# Patient Record
Sex: Female | Born: 1985
Health system: Southern US, Community
[De-identification: ages and names within clinical notes are randomized; demographics above are authoritative.]

## PROBLEM LIST (undated history)

## (undated) DIAGNOSIS — F32A Depression, unspecified: Secondary | ICD-10-CM

## (undated) DIAGNOSIS — F410 Panic disorder [episodic paroxysmal anxiety] without agoraphobia: Secondary | ICD-10-CM

## (undated) DIAGNOSIS — Z5189 Encounter for other specified aftercare: Secondary | ICD-10-CM

## (undated) DIAGNOSIS — O1490 Unspecified pre-eclampsia, unspecified trimester: Secondary | ICD-10-CM

## (undated) DIAGNOSIS — O119 Pre-existing hypertension with pre-eclampsia, unspecified trimester: Secondary | ICD-10-CM

## (undated) DIAGNOSIS — K219 Gastro-esophageal reflux disease without esophagitis: Secondary | ICD-10-CM

## (undated) HISTORY — DX: Panic disorder (episodic paroxysmal anxiety): F41.0

## (undated) HISTORY — DX: Encounter for other specified aftercare: Z51.89

## (undated) HISTORY — PX: NO PAST SURGERIES: SHX2092

---
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
## 2008-04-17 ENCOUNTER — Other Ambulatory Visit: Admission: RE | Admit: 2008-04-17 | Discharge: 2008-04-17 | Payer: Self-pay | Admitting: Obstetrics and Gynecology

## 2008-07-07 ENCOUNTER — Other Ambulatory Visit: Payer: Self-pay | Admitting: Emergency Medicine

## 2008-07-07 ENCOUNTER — Inpatient Hospital Stay (HOSPITAL_COMMUNITY): Admission: AD | Admit: 2008-07-07 | Discharge: 2008-07-10 | Payer: Self-pay | Admitting: Obstetrics and Gynecology

## 2008-07-07 ENCOUNTER — Ambulatory Visit: Payer: Self-pay | Admitting: Family Medicine

## 2008-09-06 ENCOUNTER — Ambulatory Visit: Payer: Self-pay | Admitting: Advanced Practice Midwife

## 2008-09-06 ENCOUNTER — Inpatient Hospital Stay (HOSPITAL_COMMUNITY): Admission: AD | Admit: 2008-09-06 | Discharge: 2008-09-07 | Payer: Self-pay | Admitting: Obstetrics & Gynecology

## 2008-09-18 ENCOUNTER — Ambulatory Visit: Payer: Self-pay | Admitting: Family Medicine

## 2008-09-18 ENCOUNTER — Inpatient Hospital Stay (HOSPITAL_COMMUNITY): Admission: AD | Admit: 2008-09-18 | Discharge: 2008-09-21 | Payer: Self-pay | Admitting: Family Medicine

## 2009-08-22 ENCOUNTER — Emergency Department: Payer: Self-pay | Admitting: Emergency Medicine

## 2010-11-18 LAB — CBC
MCHC: 33.8 g/dL (ref 30.0–36.0)
MCHC: 34.6 g/dL (ref 30.0–36.0)
MCHC: 35 g/dL (ref 30.0–36.0)
MCV: 92.3 fL (ref 78.0–100.0)
MCV: 94.4 fL (ref 78.0–100.0)
Platelets: 287 10*3/uL (ref 150–400)
Platelets: 340 10*3/uL (ref 150–400)
RBC: 2.3 MIL/uL — ABNORMAL LOW (ref 3.87–5.11)
RBC: 2.48 MIL/uL — ABNORMAL LOW (ref 3.87–5.11)
RBC: 2.56 MIL/uL — ABNORMAL LOW (ref 3.87–5.11)
RBC: 3.67 MIL/uL — ABNORMAL LOW (ref 3.87–5.11)
RDW: 12.4 % (ref 11.5–15.5)
RDW: 13.8 % (ref 11.5–15.5)
WBC: 15.4 10*3/uL — ABNORMAL HIGH (ref 4.0–10.5)
WBC: 20.4 10*3/uL — ABNORMAL HIGH (ref 4.0–10.5)
WBC: 24.5 10*3/uL — ABNORMAL HIGH (ref 4.0–10.5)
WBC: 31 10*3/uL — ABNORMAL HIGH (ref 4.0–10.5)
WBC: 33.2 10*3/uL — ABNORMAL HIGH (ref 4.0–10.5)

## 2010-11-18 LAB — TYPE AND SCREEN
ABO/RH(D): O POS
Antibody Screen: NEGATIVE

## 2010-11-18 LAB — COMPREHENSIVE METABOLIC PANEL
ALT: 16 U/L (ref 0–35)
Alkaline Phosphatase: 282 U/L — ABNORMAL HIGH (ref 39–117)
Calcium: 8.2 mg/dL — ABNORMAL LOW (ref 8.4–10.5)
Chloride: 102 mEq/L (ref 96–112)
GFR calc Af Amer: >60 mL/min (ref >60)
GFR calc non Af Amer: >60 mL/min (ref >60)

## 2010-11-18 LAB — CROSSMATCH
ABO/RH(D): O POS
Antibody Screen: NEGATIVE

## 2010-11-18 LAB — URIC ACID: Uric Acid, Serum: 5.3 mg/dL (ref 2.4–7.0)

## 2010-11-18 LAB — RPR: RPR Ser Ql: NONREACTIVE

## 2010-11-18 LAB — ABO/RH: ABO/RH(D): O POS

## 2010-11-18 LAB — MAGNESIUM: Magnesium: 5.6 mg/dL — ABNORMAL HIGH (ref 1.5–2.5)

## 2010-11-18 LAB — URINALYSIS, DIPSTICK ONLY
Ketones, ur: NEGATIVE mg/dL
Leukocytes, UA: NEGATIVE
Nitrite: NEGATIVE
Protein, ur: NEGATIVE mg/dL
Urobilinogen, UA: 0.2 mg/dL (ref 0.0–1.0)

## 2010-11-18 LAB — LACTATE DEHYDROGENASE: LDH: 137 U/L (ref 94–250)

## 2010-12-16 NOTE — Discharge Summary (Signed)
Sheena [name], Sheena [name]            ACCOUNT NO.:  [id number]   MEDICAL RECORD NO.:  [id number]          PATIENT TYPE:  INP   LOCATION:  9157                          FACILITY:  [facility]   PHYSICIAN:  Odie Sera, DO      DATE OF BIRTH:  Jun 12, 1986   DATE OF ADMISSION:  07/07/2008  DATE OF DISCHARGE:  07/10/2008                               DISCHARGE SUMMARY   FINAL DIAGNOSES:  1. Right pyelonephritis.  2. Twin gestation at 54 and 2/7th weeks gestational age.   REASON FOR ADMISSION:  Ms. Sheena [name] is a 25 year old gravida 2,  para 0-1-0-1 at 60 and 2/7th weeks gestational age with a known twin  gestation gestation who presented to the Maternity Admissions Unit with  complaints of fever, chills, and body aches that began on July 07, 2008, the date of presentation.  She noted a fever at home to 102  degrees.  She was seen at [facility] who then because of her pregnancy  and gestational age, we transferred her to [facility].   HOSPITAL COURSE ON THIS EVALUATION:  Her white cell count was noted to  28,000 with a left shift.  Additionally, urinalysis that was done at  [facility] revealed leukocyte Estrace and small amount of white  blood cells.  The patient was admitted to the hospital and started on IV  antibiotics.  Given her complaints of cough, she was also empirically  treated for influenza and she was started on Tamiflu as well.  Chest x-  ray was done which was essentially normal.  An ultrasound of the babies  revealed baby A at 40th percentile in growth and baby B at 45th  percentile, fetal weight discordance was 3%.  Amniotic fluid was normal.  The presentation was cephalic and transverse and the placenta is  posterior for A and anterior for B, both above the cervical os.  The  patient had a T-max of 103 in the morning of July 08, 2008, but since  then has remained completely afebrile.  On the day of admission, she is  tolerating a full p.o. diet.  She  was feeling markedly improved, has  significantly low back pain.  She is no longer has cough and is desiring  to go home.  Of note, 1-hour Glucola test was done during her  hospitalization which as result of 124.  On additional note, the initial  urinalysis was not initially sent for culture.  This was ordered on  July 09, 2008, and at the time of discharge, the culture was still  pending.  We will forward the results of the urine culture to her  primary care providers which is at [facility].   PROCEDURES:  None.   SIGNIFICANT FINDINGS:  Ultrasound chest x-ray and lab results as per his  hospital course.  Of note, her white count at the time of discharge is  15.38.   INSTRUCTIONS:  We will give her a prescription for Keflex to complete a  14-day course.  Additionally, also give her prescription for Tamiflu to  complete the 5-day course of this  medication.  The patient was  instructed to follow up with [facility] either later this week or early  next week.  We will call to get her appointment before she leaves.   I encouraged good hydration and to return to the MAU for recurrence of  fevers or worsening symptoms.  The patient expressed understanding of  these instructions and her questions were answered.      Odie Sera, DO  Electronically Signed     MC/MEDQ  D:  07/10/2008  T:  07/10/2008  Job:  [id number]   cc:   Sheena [name], M.D.  Fax: [phone]

## 2010-12-19 NOTE — H&P (Signed)
Sheena [name]            ACCOUNT NO.:  [id number]   MEDICAL RECORD NO.:  [id number]          PATIENT TYPE:  OIB   LOCATION:  LDR1                          FACILITY:  [facility]   PHYSICIAN:  Tilda Burrow, M.D. DATE OF BIRTH:  1986-04-15   DATE OF ADMISSION:  08/17/2005  DATE OF DISCHARGE:  LH                                HISTORY & PHYSICAL   REASON FOR ADMISSION:  Pregnancy at 35 weeks with some vaginal spotting.   HISTORY OF PRESENT ILLNESS:  Sheena [name] was here several days ago with nausea  and vomiting, but that has since resolved.  She states that last night she  had sex.  Upon awakening this morning, she noted that she had some dark  bleeding.   MEDICAL HISTORY:  Negative.   SURGICAL HISTORY:  Negative.   ALLERGIES:  She has no known allergies.   FAMILY HISTORY:  Positive for diabetes and coronary artery disease.   PRENATAL COURSE:  She started prenatal care at 28 weeks, so prenatal care is  limited up to this point.  Blood type is O positive.  UD is positive for  THC.  Rubella is immune.  Antibody screen is negative.  Hepatitis B surface  antigen is negative.  HIV nonreactive.  Serology nonreactive.  Twenty-eight  week hemoglobin of 11.4, hematocrit 32.7.  One hour glucose is 69.   PHYSICAL EXAMINATION:  Blood pressure is 141/100.  Fetal heart rate pattern  is stable.  Cervix is 1 cm, 80% effaced, and 0 to +1 station.   PLAN:  We are going to get a CBC, a uric acid and a liver panel, get a urine  for protein, and monitor blood pressures closely.  Plan of care will depend  on blood pressure status after the patient settles down.      Sheena [name], Sheena [name]      Tilda Burrow, M.D.  Electronically Signed    DL/MEDQ  D:  16/05/9603  T:  08/17/2005  Job:  [id number]   cc:   [facility]

## 2010-12-19 NOTE — Op Note (Signed)
NAMEFREDERICA, CHRESTMAN            ACCOUNT NO.:  [id number]   MEDICAL RECORD NO.:  [id number]          PATIENT TYPE:  INP   LOCATION:  LDR1                          FACILITY:  [facility]   PHYSICIAN:  [name], M.D.   DATE OF BIRTH:  21-Jun-1986   DATE OF PROCEDURE:  08/17/2005  DATE OF DISCHARGE:                                 OPERATIVE REPORT   Victorino Dike is an 25 year old white female, gravida 1, para 0, being induced  for preeclampsia at 35+ weeks gestation. She is requesting an epidural to be  placed. Her platelet count is 265.   The patient is placed in the sitting position. Betadine prep was used. One  percent lidocaine was injected in the L3-L4 interspace. Field drape was  placed. A 17-gauge Tuohy needle was used. It was difficult finding this  space. In fact, I had to sort of angle down and then angle up because of a  very angled spinous process. I entered the epidural space. Loss-of-  resistance technique was found. Ten cc of 0.125% bupivacaine plain was  given. The epidural catheter was fed without difficulty. An additional 10 cc  was given. It was taped down 5 cm into the epidural space. The patient  tolerated it well. She was getting good relief. Blood pressure was stable.  Fetal heart rate tracing was stable.      [name], M.D.  Electronically Signed     LHE/MEDQ  D:  08/18/2005  T:  08/18/2005  Job:  [id number]

## 2010-12-19 NOTE — Consult Note (Signed)
NAMEGUISELLE, MIAN            ACCOUNT NO.:  [id number]   MEDICAL RECORD NO.:  [id number]          PATIENT TYPE:  INP   LOCATION:  LDR1                          FACILITY:  [facility]   PHYSICIAN:  [name], M.D.   DATE OF BIRTH:  20-Mar-1986   DATE OF CONSULTATION:  DATE OF DISCHARGE:                                   CONSULTATION   Sheena [name] got comfortable with her epidural and then progressed rapidly  through labor.  Was noted to be fully dilated approximately 11:30.  She  developed an urge to push approximately 11:45.  After about a 20-minute  second stage she had a spontaneous vaginal delivery of a viable female  infant at 12:04.  Mouth and nose were suctioned on the perineum.  The body  delivered without difficulty.  A compound left hand delivered before the  posterior shoulder.  Apgars are 9 and 9.  Weight is 4 pounds 13 ounces.  20  units of Pitocin dilated in 1000 mL of lactated Ringers was infused rapidly  IV.  The placenta separated spontaneously and delivered via controlled cord  traction and maternal pushing effort at 12:15.  It was inspected and appears  to be intact with a three vessel cord.  Lochia was minimal.  The fundus  became firm after a brief massage.  Estimated blood loss about 350 mL.  The  vagina was inspected and no lacerations were found.  The epidural catheter  was then removed with blue tip visualized as being intact.  We will keep her  on magnesium sulfate for 24 hours.  She did receive some Apresoline during  labor for increased blood pressures.   Dictated by Antony Odea, nurse midwife.      Sheena [name], C.N.M.      [name], M.D.  Electronically Signed    FC/MEDQ  D:  08/18/2005  T:  08/18/2005  Job:  [id number]   cc:   Francoise Schaumann. Milford Cage DO, FAAP  Fax: [phone]

## 2011-05-08 LAB — DIFFERENTIAL
Basophils Relative: 1 % (ref 0–1)
Lymphs Abs: 3.5 10*3/uL (ref 0.7–4.0)
Lymphs Abs: 3.9 10*3/uL (ref 0.7–4.0)
Monocytes Absolute: 1.6 10*3/uL — ABNORMAL HIGH (ref 0.1–1.0)
Monocytes Relative: 6 % (ref 3–12)
Monocytes Relative: 6 % (ref 3–12)
Neutro Abs: 23.4 10*3/uL — ABNORMAL HIGH (ref 1.7–7.7)
Neutro Abs: 17.8 10*3/uL — ABNORMAL HIGH (ref 1.7–7.7)
Neutrophils Relative %: 77 % (ref 43–77)

## 2011-05-08 LAB — CULTURE, BLOOD (ROUTINE X 2): Culture: NO GROWTH

## 2011-05-08 LAB — CBC
Hemoglobin: 10.9 g/dL — ABNORMAL LOW (ref 12.0–15.0)
Hemoglobin: 9.1 g/dL — ABNORMAL LOW (ref 12.0–15.0)
MCHC: 35.1 g/dL (ref 30.0–36.0)
MCHC: 35.7 g/dL (ref 30.0–36.0)
MCV: 94.2 fL (ref 78.0–100.0)
MCV: 95.3 fL (ref 78.0–100.0)
MCV: 95.4 fL (ref 78.0–100.0)
MCV: 95.7 fL (ref 78.0–100.0)
Platelets: 241 10*3/uL (ref 150–400)
RBC: 3.31 MIL/uL — ABNORMAL LOW (ref 3.87–5.11)
RBC: 2.65 MIL/uL — ABNORMAL LOW (ref 3.87–5.11)
RBC: 2.65 MIL/uL — ABNORMAL LOW (ref 3.87–5.11)
RDW: 12.6 % (ref 11.5–15.5)
WBC: 15.3 10*3/uL — ABNORMAL HIGH (ref 4.0–10.5)
WBC: 23.2 10*3/uL — ABNORMAL HIGH (ref 4.0–10.5)
WBC: 28.5 10*3/uL — ABNORMAL HIGH (ref 4.0–10.5)

## 2011-05-08 LAB — URINALYSIS, ROUTINE W REFLEX MICROSCOPIC
Glucose, UA: NEGATIVE mg/dL
Glucose, UA: NEGATIVE mg/dL
Hgb urine dipstick: NEGATIVE
Specific Gravity, Urine: <1.005 — ABNORMAL LOW (ref 1.005–1.030)
Urobilinogen, UA: 0.2 mg/dL (ref 0.0–1.0)
pH: 6 (ref 5.0–8.0)

## 2011-05-08 LAB — COMPREHENSIVE METABOLIC PANEL
ALT: 14 U/L (ref 0–35)
AST: 20 U/L (ref 0–37)
AST: 21 U/L (ref 0–37)
Albumin: 2.2 g/dL — ABNORMAL LOW (ref 3.5–5.2)
Alkaline Phosphatase: 128 U/L — ABNORMAL HIGH (ref 39–117)
CO2: 19 mEq/L (ref 19–32)
Calcium: 7.4 mg/dL — ABNORMAL LOW (ref 8.4–10.5)
Chloride: 107 mEq/L (ref 96–112)
Chloride: 110 mEq/L (ref 96–112)
Creatinine, Ser: 0.54 mg/dL (ref 0.4–1.2)
GFR calc Af Amer: >60 mL/min (ref >60)
GFR calc Af Amer: >60 mL/min (ref >60)
GFR calc non Af Amer: >60 mL/min (ref >60)
Sodium: 131 mEq/L — ABNORMAL LOW (ref 135–145)
Total Bilirubin: 0.2 mg/dL — ABNORMAL LOW (ref 0.3–1.2)
Total Protein: 5.3 g/dL — ABNORMAL LOW (ref 6.0–8.3)

## 2011-05-08 LAB — BASIC METABOLIC PANEL
CO2: 19 mEq/L (ref 19–32)
Calcium: 8.1 mg/dL — ABNORMAL LOW (ref 8.4–10.5)
Chloride: 102 mEq/L (ref 96–112)
GFR calc Af Amer: >60 mL/min (ref >60)
Sodium: 131 mEq/L — ABNORMAL LOW (ref 135–145)

## 2011-05-08 LAB — URINE MICROSCOPIC-ADD ON

## 2011-05-08 LAB — URINE CULTURE: Culture: NO GROWTH

## 2011-05-08 LAB — INFLUENZA A+B VIRUS AG-DIRECT(RAPID): Inflenza A Ag: NEGATIVE

## 2013-02-22 ENCOUNTER — Emergency Department (HOSPITAL_BASED_OUTPATIENT_CLINIC_OR_DEPARTMENT_OTHER)
Admission: EM | Admit: 2013-02-22 | Discharge: 2013-02-22 | Disposition: A | Discharge status: Home / Self Care | Payer: Self-pay | Attending: Emergency Medicine | Admitting: Emergency Medicine

## 2013-02-22 ENCOUNTER — Encounter (HOSPITAL_BASED_OUTPATIENT_CLINIC_OR_DEPARTMENT_OTHER): Payer: Self-pay | Admitting: *Deleted

## 2013-02-22 DIAGNOSIS — S01112A Laceration without foreign body of left eyelid and periocular area, initial encounter: Secondary | ICD-10-CM

## 2013-02-22 DIAGNOSIS — S0121XA Laceration without foreign body of nose, initial encounter: Secondary | ICD-10-CM

## 2013-02-22 DIAGNOSIS — Z23 Encounter for immunization: Secondary | ICD-10-CM | POA: Insufficient documentation

## 2013-02-22 DIAGNOSIS — F17200 Nicotine dependence, unspecified, uncomplicated: Secondary | ICD-10-CM | POA: Insufficient documentation

## 2013-02-22 DIAGNOSIS — S0120XA Unspecified open wound of nose, initial encounter: Secondary | ICD-10-CM | POA: Insufficient documentation

## 2013-02-22 DIAGNOSIS — S01119A Laceration without foreign body of unspecified eyelid and periocular area, initial encounter: Secondary | ICD-10-CM | POA: Insufficient documentation

## 2013-02-22 MED ORDER — TETANUS-DIPHTH-ACELL PERTUSSIS 5-2.5-18.5 LF-MCG/0.5 IM SUSP
0.5000 mL | Freq: Once | INTRAMUSCULAR | Status: AC
  Administered 2013-02-22: 0.5 mL via INTRAMUSCULAR
  Filled 2013-02-22: qty 0.5
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
## 2013-02-27 ENCOUNTER — Emergency Department (HOSPITAL_BASED_OUTPATIENT_CLINIC_OR_DEPARTMENT_OTHER)
Admission: EM | Admit: 2013-02-27 | Discharge: 2013-02-27 | Disposition: A | Discharge status: Home / Self Care | Payer: Self-pay | Attending: Emergency Medicine | Admitting: Emergency Medicine

## 2013-02-27 ENCOUNTER — Encounter (HOSPITAL_BASED_OUTPATIENT_CLINIC_OR_DEPARTMENT_OTHER): Payer: Self-pay | Admitting: *Deleted

## 2013-02-27 DIAGNOSIS — Z4802 Encounter for removal of sutures: Secondary | ICD-10-CM | POA: Insufficient documentation

## 2013-02-27 DIAGNOSIS — F17200 Nicotine dependence, unspecified, uncomplicated: Secondary | ICD-10-CM | POA: Insufficient documentation

## 2013-02-27 NOTE — ED Provider Notes (Signed)
Medical screening examination/treatment/procedure(s) were performed by non-physician practitioner and as supervising physician I was immediately available for consultation/collaboration.  Ethelda Chick, MD 02/27/13 [phone]

## 2013-02-27 NOTE — ED Provider Notes (Signed)
  CSN: [id number]     Arrival date & time 02/27/13  1749 History     First MD Initiated Contact with Patient 02/27/13 1800     Chief Complaint  Patient presents with  . Suture / Staple Removal   (Consider location/radiation/quality/duration/timing/severity/associated sxs/prior Treatment) Patient is a 27 y.o. female presenting with suture removal. The history is provided by the patient. No language interpreter was used.  Suture / Staple Removal This is a new problem. The current episode started in the past 7 days. The problem occurs constantly. The problem has been gradually worsening. Nothing aggravates the symptoms. She has tried nothing for the symptoms. The treatment provided mild relief.    History reviewed. No pertinent past medical history. History reviewed. No pertinent past surgical history. History reviewed. No pertinent family history. History  Substance Use Topics  . Smoking status: Current Every Day Smoker  . Smokeless tobacco: Not on file  . Alcohol Use: Yes   OB History   Grav Para Term Preterm Abortions TAB SAB Ect Mult Living                 Review of Systems  All other systems reviewed and are negative.    Allergies  Review of patient's allergies indicates no known allergies.  Home Medications  No current outpatient prescriptions on file. BP 135/78  Pulse 71  Temp(Src) 98.3 F (36.8 C) (Oral)  Resp 16  Ht 5\' 4"  (1.626 m)  Wt 190 lb (86.183 kg)  BMI 32.6 kg/m2  SpO2 99%  LMP 02/15/2013 Physical Exam  Nursing note and vitals reviewed. Constitutional: She is oriented to person, place, and time. She appears well-developed.  HENT:  Head: Normocephalic.  Laceration healing no infection  Neurological: She is alert and oriented to person, place, and time. She has normal reflexes.  Skin: Skin is warm.  Psychiatric: She has a normal mood and affect.    ED Course   Procedures (including critical care time) Scar goes against nature lines.   Pt  counseled on scar revision.   Pt referred to Dr. Kelly Splinter Labs Reviewed - No data to display No results found. 1. Visit for suture removal     MDM  Pt referred to Dr. Wyatt Haste for scar evaluation  Elson Areas, PA-C 02/27/13 1816

## 2013-02-27 NOTE — ED Notes (Signed)
LAst seen 23rd here for suture removal left eyelid

## 2013-10-31 ENCOUNTER — Encounter (HOSPITAL_BASED_OUTPATIENT_CLINIC_OR_DEPARTMENT_OTHER): Payer: Self-pay | Admitting: Emergency Medicine

## 2013-10-31 ENCOUNTER — Emergency Department (HOSPITAL_BASED_OUTPATIENT_CLINIC_OR_DEPARTMENT_OTHER)
Admission: EM | Admit: 2013-10-31 | Discharge: 2013-10-31 | Disposition: A | Discharge status: Home / Self Care | Payer: Medicaid Other | Attending: Emergency Medicine | Admitting: Emergency Medicine

## 2013-10-31 DIAGNOSIS — F0781 Postconcussional syndrome: Secondary | ICD-10-CM | POA: Insufficient documentation

## 2013-10-31 DIAGNOSIS — R51 Headache: Secondary | ICD-10-CM

## 2013-10-31 DIAGNOSIS — G43909 Migraine, unspecified, not intractable, without status migrainosus: Secondary | ICD-10-CM | POA: Insufficient documentation

## 2013-10-31 DIAGNOSIS — R519 Headache, unspecified: Secondary | ICD-10-CM

## 2013-10-31 DIAGNOSIS — F17200 Nicotine dependence, unspecified, uncomplicated: Secondary | ICD-10-CM | POA: Insufficient documentation

## 2013-10-31 MED ORDER — SODIUM CHLORIDE 0.9 % IV BOLUS (SEPSIS)
1000.0000 mL | Freq: Once | INTRAVENOUS | Status: AC
  Administered 2013-10-31: 1000 mL via INTRAVENOUS

## 2013-10-31 MED ORDER — NAPROXEN 500 MG PO TABS: 500.0000 mg | ORAL_TABLET | Freq: Two times a day (BID) | ORAL | Status: DC

## 2013-10-31 MED ORDER — ONDANSETRON HCL 4 MG PO TABS: 4.0000 mg | ORAL_TABLET | Freq: Four times a day (QID) | ORAL | Status: DC

## 2013-10-31 MED ORDER — ONDANSETRON HCL 4 MG/2ML IJ SOLN
4.0000 mg | Freq: Once | INTRAMUSCULAR | Status: AC
  Administered 2013-10-31: 4 mg via INTRAVENOUS
  Filled 2013-10-31: qty 2

## 2013-10-31 MED ORDER — KETOROLAC TROMETHAMINE 30 MG/ML IJ SOLN
30.0000 mg | Freq: Once | INTRAMUSCULAR | Status: AC
  Administered 2013-10-31: 30 mg via INTRAVENOUS
  Filled 2013-10-31: qty 1
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
## 2013-10-31 NOTE — Discharge Instructions (Signed)
Head Injury, Adult °You have received a head injury. It does not appear serious at this time. Headaches and vomiting are common following head injury. It should be easy to awaken from sleeping. Sometimes it is necessary for you to stay in the emergency department for a while for observation. Sometimes admission to the hospital may be needed. After injuries such as yours, most problems occur within the first 24 hours, but side effects may occur up to 7 10 days after the injury. It is important for you to carefully monitor your condition and contact your health care provider or seek immediate medical care if there is a change in your condition. °WHAT ARE THE TYPES OF HEAD INJURIES? °Head injuries can be as minor as a bump. Some head injuries can be more severe. More severe head injuries include: °· A jarring injury to the brain (concussion). °· A bruise of the brain (contusion). This mean there is bleeding in the brain that can cause swelling. °· A cracked skull (skull fracture). °· Bleeding in the brain that collects, clots, and forms a bump (hematoma). °WHAT CAUSES A HEAD INJURY? °A serious head injury is most likely to happen to someone who is in a car wreck and is not wearing a seat belt. Other causes of major head injuries include bicycle or motorcycle accidents, sports injuries, and falls. °HOW ARE HEAD INJURIES DIAGNOSED? °A complete history of the event leading to the injury and your current symptoms will be helpful in diagnosing head injuries. Many times, pictures of the brain, such as CT or MRI are needed to see the extent of the injury. Often, an overnight hospital stay is necessary for observation.  °WHEN SHOULD I SEEK IMMEDIATE MEDICAL CARE?  °You should get help right away if: °· You have confusion or drowsiness. °· You feel sick to your stomach (nauseous) or have continued, forceful vomiting. °· You have dizziness or unsteadiness that is getting worse. °· You have severe, continued headaches not  relieved by medicine. Only take over-the-counter or prescription medicines for pain, fever, or discomfort as directed by your health care provider. °· You do not have normal function of the arms or legs or are unable to walk. °· You notice changes in the black spots in the center of the colored part of your eye (pupil). °· You have a clear or bloody fluid coming from your nose or ears. °· You have a loss of vision. °During the next 24 hours after the injury, you must stay with someone who can watch you for the warning signs. This person should contact local emergency services (911 in the [place] if you have seizures, you become unconscious, or you are unable to wake up. °HOW CAN I PREVENT A HEAD INJURY IN THE FUTURE? °The most important factor for preventing major head injuries is avoiding motor vehicle accidents.  To minimize the potential for damage to your head, it is crucial to wear seat belts while riding in motor vehicles. Wearing helmets while bike riding and playing collision sports (like football) is also helpful. Also, avoiding dangerous activities around the house will further help reduce your risk of head injury.  °WHEN CAN I RETURN TO NORMAL ACTIVITIES AND ATHLETICS? °You should be reevaluated by your health care provider before returning to these activities. If you have any of the following symptoms, you should not return to activities or contact sports until 1 week after the symptoms have stopped: °· Persistent headache. °· Dizziness or vertigo. °· Poor attention and concentration. °·   Confusion.  Memory problems.  Nausea or vomiting.  Fatigue or tire easily.  Irritability.  Intolerant of bright lights or loud noises.  Anxiety or depression.  Disturbed sleep. MAKE SURE YOU:   Understand these instructions.  Will watch your condition.  Will get help right away if you are not doing well or get worse. Document Released: 07/20/2005 Document Revised: 05/10/2013 Document Reviewed:  03/27/2013 [facility] Patient Information 2014 [place]ExitCare, [place].  Post-Concussion Syndrome Post-concussion syndrome describes the symptoms that can occur after a head injury. These symptoms can last from weeks to months. CAUSES  It is not clear why some head injuries cause post-concussion syndrome. It can occur whether your head injury was mild or severe and whether you were wearing head protection or not.  SIGNS AND SYMPTOMS  Memory difficulties.  Dizziness.  Headaches.  Double vision or blurry vision.  Sensitivity to light.  Hearing difficulties.  Depression.  Tiredness.  Weakness.  Difficulty with concentration.  Difficulty sleeping or staying asleep.  Vomiting.  Poor balance or instability on your feet.  Slow reaction time.  Difficulty learning and remembering things you have heard. DIAGNOSIS  There is no test to determine whether you have post-concussion syndrome. Your health care provider may order an imaging scan of your brain, such as a CT scan, to check for other problems that may be causing your symptoms (such as severe injury inside your skull). TREATMENT  Usually, these problems disappear over time without medical care. Your health care provider may prescribe medicine to help ease your symptoms. It is important to follow up with a neurologist to evaluate your recovery and address any lingering symptoms or issues. HOME CARE INSTRUCTIONS   Only take over-the-counter or prescription medicines for pain, discomfort, or fever as directed by your health care provider. Do not take aspirin. Aspirin can slow blood clotting.  Sleep with your head slightly elevated to help with headaches.  Avoid any situation where there is potential for another head injury (football, hockey, soccer, basketball, martial arts, downhill snow sports, and horseback riding). Your condition will get worse every time you experience a concussion. You should avoid these activities until you are  evaluated by the appropriate follow-up health care providers.  Keep all follow-up appointments as directed by your health care provider. SEEK IMMEDIATE MEDICAL CARE IF:  You develop confusion or unusual drowsiness.  You cannot wake the injured person.  You develop nausea or persistent, forceful vomiting.  You feel like you are moving when you are not (vertigo).  You notice the injured person's eyes moving rapidly back and forth. This may be a sign of vertigo.  You have convulsions or faint.  You have severe, persistent headaches that are not relieved by medicine.  You cannot use your arms or legs normally.  Your pupils change size.  You have clear or bloody discharge from the nose or ears.  Your problems are getting worse, not better. MAKE SURE YOU:  Understand these instructions.  Will watch your condition.  Will get help right away if you are not doing well or get worse. Document Released: 01/09/2002 Document Revised: 05/10/2013 Document Reviewed: 02/05/2011 [facility] Patient Information 2014 [place]ExitCare, [place]LLC.
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
## 2014-01-04 ENCOUNTER — Encounter (HOSPITAL_BASED_OUTPATIENT_CLINIC_OR_DEPARTMENT_OTHER): Payer: Self-pay | Admitting: Emergency Medicine

## 2014-01-04 ENCOUNTER — Emergency Department (HOSPITAL_BASED_OUTPATIENT_CLINIC_OR_DEPARTMENT_OTHER)
Admission: EM | Admit: 2014-01-04 | Discharge: 2014-01-04 | Disposition: A | Discharge status: Home / Self Care | Payer: Medicaid Other | Attending: Emergency Medicine | Admitting: Emergency Medicine

## 2014-01-04 DIAGNOSIS — Z79899 Other long term (current) drug therapy: Secondary | ICD-10-CM | POA: Insufficient documentation

## 2014-01-04 DIAGNOSIS — K006 Disturbances in tooth eruption: Secondary | ICD-10-CM | POA: Insufficient documentation

## 2014-01-04 DIAGNOSIS — F17200 Nicotine dependence, unspecified, uncomplicated: Secondary | ICD-10-CM | POA: Insufficient documentation

## 2014-01-04 DIAGNOSIS — K029 Dental caries, unspecified: Secondary | ICD-10-CM

## 2014-01-04 DIAGNOSIS — Z792 Long term (current) use of antibiotics: Secondary | ICD-10-CM | POA: Insufficient documentation

## 2014-01-04 DIAGNOSIS — Z791 Long term (current) use of non-steroidal anti-inflammatories (NSAID): Secondary | ICD-10-CM | POA: Insufficient documentation

## 2014-01-04 MED ORDER — PENICILLIN V POTASSIUM 500 MG PO TABS: 500.0000 mg | ORAL_TABLET | Freq: Four times a day (QID) | ORAL | Status: AC

## 2014-01-04 MED ORDER — OXYCODONE-ACETAMINOPHEN 5-325 MG PO TABS
1.0000 | ORAL_TABLET | Freq: Once | ORAL | Status: AC
  Administered 2014-01-04: 1 via ORAL
  Filled 2014-01-04: qty 1

## 2014-01-04 MED ORDER — HYDROCODONE-ACETAMINOPHEN 5-325 MG PO TABS: 1.0000 | ORAL_TABLET | Freq: Four times a day (QID) | ORAL | Status: DC | PRN

## 2014-01-04 MED ORDER — IBUPROFEN 600 MG PO TABS: 600.0000 mg | ORAL_TABLET | Freq: Four times a day (QID) | ORAL | Status: DC | PRN

## 2014-01-04 NOTE — Discharge Instructions (Signed)
Dental Pain A tooth ache may be caused by cavities (tooth decay). Cavities expose the nerve of the tooth to air and hot or cold temperatures. It may come from an infection or abscess (also called a boil or furuncle) around your tooth. It is also often caused by dental caries (tooth decay). This causes the pain you are having. DIAGNOSIS  Your caregiver can diagnose this problem by exam. TREATMENT   If caused by an infection, it may be treated with medications which kill germs (antibiotics) and pain medications as prescribed by your caregiver. Take medications as directed.  Only take over-the-counter or prescription medicines for pain, discomfort, or fever as directed by your caregiver.  Whether the tooth ache today is caused by infection or dental disease, you should see your dentist as soon as possible for further care. SEEK MEDICAL CARE IF: The exam and treatment you received today has been provided on an emergency basis only. This is not a substitute for complete medical or dental care. If your problem worsens or new problems (symptoms) appear, and you are unable to meet with your dentist, call or return to this location. SEEK IMMEDIATE MEDICAL CARE IF:   You have a fever.  You develop redness and swelling of your face, jaw, or neck.  You are unable to open your mouth.  You have severe pain uncontrolled by pain medicine. MAKE SURE YOU:   Understand these instructions.  Will watch your condition.  Will get help right away if you are not doing well or get worse. Document Released: 07/20/2005 Document Revised: 10/12/2011 Document Reviewed: 03/07/2008 [facility] Patient Information 2014 [place], [place].  Dental Caries Dental caries is tooth decay. This decay can cause a hole in teeth (cavity) that can get bigger and deeper over time. HOME CARE  Brush and floss your teeth. Do this at least two times a day.  Use a fluoride toothpaste.  Use a mouth rinse if told by your dentist or  doctor.  Eat less sugary and starchy foods. Drink less sugary drinks.  Avoid snacking often on sugary and starchy foods. Avoid sipping often on sugary drinks.  Keep regular checkups and cleanings with your dentist.  Use fluoride supplements if told by your dentist or doctor.  Allow fluoride to be applied to teeth if told by your dentist or doctor. MAKE SURE YOU:  Understand these instructions.  Will watch your condition.  Will get help right away if you are not doing well or get worse. Document Released: 04/28/2008 Document Revised: 03/22/2013 Document Reviewed: 07/22/2012 [facility] Patient Information 2014 [place], [place].

## 2014-01-04 NOTE — ED Provider Notes (Signed)
CSN: [id number]     Arrival date & time 01/04/14  0008 History   First MD Initiated Contact with Patient 01/04/14 0029     Chief Complaint  Patient presents with  . Dental Pain     (Consider location/radiation/quality/duration/timing/severity/associated sxs/prior Treatment) HPI  This is a 28 yo female who presents with dental pain. She reports symptoms for one week. She currently rates her pain at 8/10. Ibuprofen  at home isn't helping. She denies any fevers, shortness of breath, difficulty swallowing.  She does not have a dentist.  History reviewed. No pertinent past medical history. History reviewed. No pertinent past surgical history. History reviewed. No pertinent family history. History  Substance Use Topics  . Smoking status: Current Every Day Smoker -- 1.00 packs/day    Types: Cigarettes  . Smokeless tobacco: Not on file  . Alcohol Use: Yes   OB History   Grav Para Term Preterm Abortions TAB SAB Ect Mult Living                 Review of Systems  HENT: Positive for dental problem. Negative for facial swelling and trouble swallowing.   Respiratory: Negative for shortness of breath.   All other systems reviewed and are negative.     Allergies  Review of patient's allergies indicates no known allergies.  Home Medications   Prior to Admission medications   Medication Sig Start Date End Date Taking? Authorizing Provider  HYDROcodone-acetaminophen (NORCO/VICODIN) 5-325 MG per tablet Take 1 tablet by mouth every 6 (six) hours as needed. 01/04/14   Shon Baton, MD  ibuprofen (ADVIL,MOTRIN) 600 MG tablet Take 1 tablet (600 mg total) by mouth every 6 (six) hours as needed. 01/04/14   Shon Baton, MD  penicillin v potassium (VEETID) 500 MG tablet Take 1 tablet (500 mg total) by mouth 4 (four) times daily. 01/04/14 01/11/14  Shon Baton, MD   BP 149/89  Pulse 96  Temp(Src) 98.3 F (36.8 C) (Oral)  Resp 20  Ht 5\' 4"  (1.626 m)  Wt 206 lb (93.441 kg)  BMI 35.34  kg/m2  SpO2 98%  LMP 12/20/2013 Physical Exam  Nursing note and vitals reviewed. Constitutional: She is oriented to person, place, and time. She appears well-developed and well-nourished.  HENT:  Head: Normocephalic and atraumatic.  Multiple dental caries noted with generally poor dentition, fracture of left upper molar with dental. Present, no obvious abscess or significant facial swelling to  Cardiovascular: Normal rate and regular rhythm.   Pulmonary/Chest: Effort normal. No respiratory distress.  Neurological: She is alert and oriented to person, place, and time.  Skin: Skin is warm and dry.  Psychiatric: She has a normal mood and affect.    ED Course  Procedures (including critical care time) Labs Review Labs Reviewed - No data to display  Imaging Review No results found.   EKG Interpretation None      MDM   Final diagnoses:  Pain due to dental caries     patient presents with dental pain. She is nontoxic on exam. Airway is intact. No obvious abscess on exam but extensive dental caries and a fractured left upper molar. Patient was given Percocet. She will be discharged with a short course of pain medication and antibiotics. She was given dental referral. She was given strict return precautions.  After history, exam, and medical workup I feel the patient has been appropriately medically screened and is safe for discharge home. Pertinent diagnoses were discussed with the patient. Patient  was given return precautions.     Shon Batonourtney F Horton, MD 01/04/14 [phone]

## 2014-01-04 NOTE — ED Notes (Signed)
Pt c/o dental pain x 1 week 

## 2014-12-27 ENCOUNTER — Emergency Department (HOSPITAL_BASED_OUTPATIENT_CLINIC_OR_DEPARTMENT_OTHER)
Admission: EM | Admit: 2014-12-27 | Discharge: 2014-12-27 | Disposition: A | Discharge status: Home / Self Care | Payer: Medicaid Other | Attending: Emergency Medicine | Admitting: Emergency Medicine

## 2014-12-27 ENCOUNTER — Encounter (HOSPITAL_BASED_OUTPATIENT_CLINIC_OR_DEPARTMENT_OTHER): Payer: Self-pay | Admitting: *Deleted

## 2014-12-27 ENCOUNTER — Emergency Department (HOSPITAL_BASED_OUTPATIENT_CLINIC_OR_DEPARTMENT_OTHER): Payer: Medicaid Other

## 2014-12-27 DIAGNOSIS — Y998 Other external cause status: Secondary | ICD-10-CM | POA: Insufficient documentation

## 2014-12-27 DIAGNOSIS — S79912A Unspecified injury of left hip, initial encounter: Secondary | ICD-10-CM | POA: Diagnosis not present

## 2014-12-27 DIAGNOSIS — S99912A Unspecified injury of left ankle, initial encounter: Secondary | ICD-10-CM | POA: Diagnosis present

## 2014-12-27 DIAGNOSIS — S8992XA Unspecified injury of left lower leg, initial encounter: Secondary | ICD-10-CM | POA: Diagnosis not present

## 2014-12-27 DIAGNOSIS — S93402A Sprain of unspecified ligament of left ankle, initial encounter: Secondary | ICD-10-CM | POA: Diagnosis not present

## 2014-12-27 DIAGNOSIS — Y9389 Activity, other specified: Secondary | ICD-10-CM | POA: Diagnosis not present

## 2014-12-27 DIAGNOSIS — W1839XA Other fall on same level, initial encounter: Secondary | ICD-10-CM | POA: Insufficient documentation

## 2014-12-27 DIAGNOSIS — Y9289 Other specified places as the place of occurrence of the external cause: Secondary | ICD-10-CM | POA: Insufficient documentation

## 2014-12-27 DIAGNOSIS — Z720 Tobacco use: Secondary | ICD-10-CM | POA: Insufficient documentation

## 2014-12-27 MED ORDER — IBUPROFEN 800 MG PO TABS: 800.0000 mg | ORAL_TABLET | Freq: Three times a day (TID) | ORAL | Status: DC

## 2014-12-27 NOTE — Discharge Instructions (Signed)

## 2014-12-27 NOTE — ED Notes (Signed)
D/c home with friend/family- directed to pharmacy to pick up meds

## 2014-12-27 NOTE — ED Provider Notes (Signed)
CSN: [id number]     Arrival date & time 12/27/14  0911 History  This chart was scribed for Glynn OctaveStephen Sherrelle Prochazka, MD by Leone PayorSonum Patel, ED Scribe. This patient was seen in room MH12/MH12 and the patient's care was started 9:26 AM.    Chief Complaint  Patient presents with  . Ankle Injury    left   The history is provided by the patient. No language interpreter was used.    HPI Comments: Sheena [name] is a 29 y.o. female who presents to the Emergency Department complaining of constant, unchanged left lateral ankle and foot pain with associated mild swelling which began 2 weeks ago. She reports she was dancing when she twisted her left ankle and fell. She also complains of left hip pain and minimal left knee and lower leg pain. She reports having to stand for numerous hours at work but has been applying ice and wrapping with ACE bandages. She has not taken anything for pain. She denies numbness, weakness, paresthesias, gait problem.   History reviewed. No pertinent past medical history. History reviewed. No pertinent past surgical history. No family history on file. History  Substance Use Topics  . Smoking status: Current Every Day Smoker -- 1.00 packs/day    Types: Cigarettes  . Smokeless tobacco: Not on file  . Alcohol Use: Yes   OB History    No data available     Review of Systems  A complete 10 system review of systems was obtained and all systems are negative except as noted in the HPI and PMH.    Allergies  Review of patient's allergies indicates no known allergies.  Home Medications   Prior to Admission medications   Medication Sig Start Date End Date Taking? Authorizing Provider  HYDROcodone-acetaminophen (NORCO/VICODIN) 5-325 MG per tablet Take 1 tablet by mouth every 6 (six) hours as needed. 01/04/14   Shon Batonourtney F Horton, MD  ibuprofen (ADVIL,MOTRIN) 800 MG tablet Take 1 tablet (800 mg total) by mouth 3 (three) times daily. 12/27/14   Glynn OctaveStephen Toni Demo, MD   BP 145/85 mmHg   Pulse 119  Temp(Src) 98.4 F (36.9 C) (Oral)  Resp 20  Ht 5\' 4"  (1.626 m)  Wt 192 lb (87.091 kg)  BMI 32.94 kg/m2  SpO2 100%  LMP 12/13/2014 Physical Exam  Constitutional: She is oriented to person, place, and time. She appears well-developed and well-nourished. No distress.  HENT:  Head: Normocephalic and atraumatic.  Mouth/Throat: Oropharynx is clear and moist. No oropharyngeal exudate.  Eyes: Conjunctivae and EOM are normal. Pupils are equal, round, and reactive to light.  Neck: Normal range of motion. Neck supple.  No meningismus.  Cardiovascular: Normal rate, regular rhythm, normal heart sounds and intact distal pulses.   No murmur heard. Pulmonary/Chest: Effort normal and breath sounds normal. No respiratory distress.  Abdominal: Soft. There is no tenderness. There is no rebound and no guarding.  Musculoskeletal: Normal range of motion. She exhibits tenderness. She exhibits no edema.  LLE: Swelling over left lateral malleolus. Intact dp/pt pulse. Flexion and extension intact. Achilles intact. Mild tenderness at proximal fibula.   Neurological: She is alert and oriented to person, place, and time. No cranial nerve deficit. She exhibits normal muscle tone. Coordination normal.  No ataxia on finger to nose bilaterally. No pronator drift. 5/5 strength throughout. CN 2-12 intact. Negative Romberg. Equal grip strength. Sensation intact. Gait is normal.   Skin: Skin is warm.  Psychiatric: She has a normal mood and affect. Her behavior is normal.  Nursing note and vitals reviewed.   ED Course  Procedures (including critical care time)  DIAGNOSTIC STUDIES: Oxygen Saturation is 100% on RA, normal by my interpretation.    COORDINATION OF CARE: 9:30 AM Discussed treatment plan with pt at bedside and pt agreed to plan.   Labs Review Labs Reviewed - No data to display  Imaging Review Dg Ankle Complete Left  12/27/2014   CLINICAL DATA:  Patient fell with rolling injury left ankle 2  weeks prior. Persistent pain and swelling  EXAM: LEFT ANKLE COMPLETE - 3+ VIEW  COMPARISON:  None.  FINDINGS: Frontal, oblique, and lateral views were obtained. There is no demonstrable fracture or effusion. The ankle mortise appears intact. No appreciable joint space narrowing.  IMPRESSION: No fracture or appreciable arthropathy.  Mortise appears intact.   Electronically Signed   By: Bretta Bang III M.D.   On: 12/27/2014 09:57   Dg Knee Complete 4 Views Left  12/27/2014   CLINICAL DATA:  Patient fell with twisting injury 2 weeks prior. Persistent pain in swelling.  EXAM: LEFT KNEE - COMPLETE 4+ VIEW  COMPARISON:  None.  FINDINGS: Frontal, lateral, and bilateral oblique views were obtained. There is no fracture or dislocation. No effusion. Joint spaces appear intact. A small benign exostosis is noted arising from the medial proximal fibular metaphysis.  IMPRESSION: Small benign-appearing exostosis arising from the proximal fibula medially. No fracture or joint effusion. No appreciable arthropathic change.   Electronically Signed   By: Bretta Bang III M.D.   On: 12/27/2014 09:59     EKG Interpretation None      MDM   Final diagnoses:  Ankle sprain, left, initial encounter   Left ankle swelling after twisting it 2 weeks ago. Pain radiates up to knee and hip.  X-rays negative for fracture. Incidental exostosis of fibula.  Patient given ASO. She declines crutches. D/w patient RICE therapy, follow up with sports med.   I personally performed the services described in this documentation, which was scribed in my presence. The recorded information has been reviewed and is accurate.   Glynn Octave, MD 12/27/14 [phone]

## 2014-12-27 NOTE — ED Notes (Signed)
Patient states she twisted her left ankle about a week ago and continues to have pain and swelling, with shooting pain into her left knee and hip.

## 2016-05-04 ENCOUNTER — Encounter (HOSPITAL_BASED_OUTPATIENT_CLINIC_OR_DEPARTMENT_OTHER): Payer: Self-pay

## 2016-05-04 ENCOUNTER — Emergency Department (HOSPITAL_BASED_OUTPATIENT_CLINIC_OR_DEPARTMENT_OTHER): Payer: Medicaid Other

## 2016-05-04 ENCOUNTER — Emergency Department (HOSPITAL_BASED_OUTPATIENT_CLINIC_OR_DEPARTMENT_OTHER)
Admission: EM | Admit: 2016-05-04 | Discharge: 2016-05-04 | Disposition: A | Discharge status: Home / Self Care | Payer: Medicaid Other | Attending: Emergency Medicine | Admitting: Emergency Medicine

## 2016-05-04 DIAGNOSIS — R109 Unspecified abdominal pain: Secondary | ICD-10-CM | POA: Diagnosis present

## 2016-05-04 DIAGNOSIS — F17210 Nicotine dependence, cigarettes, uncomplicated: Secondary | ICD-10-CM | POA: Insufficient documentation

## 2016-05-04 DIAGNOSIS — K529 Noninfective gastroenteritis and colitis, unspecified: Secondary | ICD-10-CM | POA: Diagnosis not present

## 2016-05-04 LAB — COMPREHENSIVE METABOLIC PANEL
ALBUMIN: 4.1 g/dL (ref 3.5–5.0)
ALK PHOS: 92 U/L (ref 38–126)
ALT: 43 U/L (ref 14–54)
ANION GAP: 10 (ref 5–15)
AST: 39 U/L (ref 15–41)
BUN: 9 mg/dL (ref 6–20)
CHLORIDE: 103 mmol/L (ref 101–111)
CO2: 23 mmol/L (ref 22–32)
Calcium: 8.8 mg/dL — ABNORMAL LOW (ref 8.9–10.3)
Creatinine, Ser: 0.75 mg/dL (ref 0.44–1.00)
GFR calc non Af Amer: >60 mL/min (ref >60)
GLUCOSE: 90 mg/dL (ref 65–99)
Potassium: 3.8 mmol/L (ref 3.5–5.1)
SODIUM: 136 mmol/L (ref 135–145)
Total Bilirubin: 0.5 mg/dL (ref 0.3–1.2)
Total Protein: 7.6 g/dL (ref 6.5–8.1)

## 2016-05-04 LAB — URINALYSIS, ROUTINE W REFLEX MICROSCOPIC
GLUCOSE, UA: NEGATIVE mg/dL
HGB URINE DIPSTICK: NEGATIVE
Ketones, ur: 15 mg/dL — AB
Nitrite: NEGATIVE
PROTEIN: NEGATIVE mg/dL
SPECIFIC GRAVITY, URINE: 1.025 (ref 1.005–1.030)
pH: 5.5 (ref 5.0–8.0)

## 2016-05-04 LAB — URINE MICROSCOPIC-ADD ON: RBC / HPF: NONE SEEN RBC/hpf (ref 0–5)

## 2016-05-04 LAB — CBC
HEMATOCRIT: 45.1 % (ref 36.0–46.0)
HEMOGLOBIN: 16.1 g/dL — AB (ref 12.0–15.0)
MCH: 32.7 pg (ref 26.0–34.0)
MCHC: 35.7 g/dL (ref 30.0–36.0)
MCV: 91.7 fL (ref 78.0–100.0)
Platelets: 273 10*3/uL (ref 150–400)
RBC: 4.92 MIL/uL (ref 3.87–5.11)
RDW: 11.4 % — ABNORMAL LOW (ref 11.5–15.5)
WBC: 14.1 10*3/uL — ABNORMAL HIGH (ref 4.0–10.5)

## 2016-05-04 LAB — PREGNANCY, URINE: PREG TEST UR: NEGATIVE

## 2016-05-04 LAB — LIPASE, BLOOD: LIPASE: 19 U/L (ref 11–51)

## 2016-05-04 MED ORDER — ACETAMINOPHEN 325 MG PO TABS
650.0000 mg | ORAL_TABLET | Freq: Once | ORAL | Status: AC
  Administered 2016-05-04: 650 mg via ORAL
  Filled 2016-05-04: qty 2

## 2016-05-04 MED ORDER — IOPAMIDOL (ISOVUE-300) INJECTION 61%
100.0000 mL | Freq: Once | INTRAVENOUS | Status: AC | PRN
  Administered 2016-05-04: 100 mL via INTRAVENOUS

## 2016-05-04 MED ORDER — PROMETHAZINE HCL 25 MG PO TABS: 25.0000 mg | ORAL_TABLET | Freq: Four times a day (QID) | ORAL | 0 refills | Status: DC | PRN

## 2016-05-04 MED ORDER — ONDANSETRON HCL 4 MG/2ML IJ SOLN
4.0000 mg | Freq: Once | INTRAMUSCULAR | Status: AC | PRN
  Administered 2016-05-04: 4 mg via INTRAVENOUS
  Filled 2016-05-04: qty 2

## 2016-05-04 MED ORDER — POLYETHYLENE GLYCOL 3350 17 G PO PACK: 17.0000 g | PACK | Freq: Every day | ORAL | 0 refills | Status: DC

## 2016-05-04 NOTE — ED Notes (Signed)
PA at bedside.

## 2016-05-04 NOTE — ED Provider Notes (Signed)
[facility] Provider Note   CSN: [id number] Arrival date & time: 05/04/16  1655  By signing my name below, I, Sheena [name], attest that this documentation has been prepared under the direction and in the presence of Sheena HelperBowie Emari Demmer, PA-C. Electronically Signed: Phillis HaggisGabriella [name], ED Scribe. 05/04/16. 7:17 PM.  History   Chief Complaint Chief Complaint  Patient presents with  . Abdominal Pain   The history is provided by the patient. No language interpreter was used.   HPI Comments: Sheena [name] is a 30 y.o. Female with a hx of alcohol abuse who presents to the Emergency Department complaining of stabbing, intermittent, gradually improving, generalized abdominal pain that radiates to the left back onset 8 hours ago. She notes the worst pain is in the epigastric region. Pt reports associated nausea, vomiting x2, headache, and constipation. Pt has had a productive cough in the past week with associated congestion. Pt drinks 4-12 beers a night. Pt's last consumption of alcohol was last night, but did not disclose how much she drank. She has not tried anything for her symptoms. Pt smokes 1 ppd. She denies hx of abdominal surgery, fever, chills, diaphoresis, SOB, chest pain, hematochezia, hematemesis, dysuria, or hematuria. LMP ended 4 days ago.   History reviewed. No pertinent past medical history.  There are no active problems to display for this patient.  History reviewed. No pertinent surgical history.  OB History    No data available     Home Medications    Prior to Admission medications   Not on File    Family History No family history on file.  Social History Social History  Substance Use Topics  . Smoking status: Current Every Day Smoker    Packs/day: 1.00    Types: Cigarettes  . Smokeless tobacco: Never Used  . Alcohol use Yes     Comment: daily   Allergies   Review of patient's allergies indicates no known allergies.  Review of Systems Review of  Systems  Constitutional: Negative for chills, diaphoresis and fever.  HENT: Positive for congestion.   Respiratory: Positive for cough. Negative for shortness of breath.   Cardiovascular: Negative for chest pain.  Gastrointestinal: Positive for abdominal pain, constipation, nausea and vomiting. Negative for blood in stool.  Genitourinary: Negative for dysuria and hematuria.  All other systems reviewed and are negative.  Physical Exam Updated Vital Signs BP 133/83 (BP Location: Left Arm)   Pulse (!) 132   Temp 98.3 F (36.8 C) (Oral)   Resp 20   Ht 5\' 4"  (1.626 m)   Wt 180 lb (81.6 kg)   LMP 04/01/2016   SpO2 99%   BMI 30.90 kg/m   Physical Exam  Constitutional: She is oriented to person, place, and time. She appears well-developed and well-nourished.  HENT:  Head: Normocephalic and atraumatic.  Eyes: EOM are normal. Pupils are equal, round, and reactive to light.  Neck: Normal range of motion. Neck supple.  Cardiovascular: Normal rate, regular rhythm and normal heart sounds.  Exam reveals no gallop and no friction rub.   No murmur heard. Pulmonary/Chest: Effort normal. She has wheezes.  Faint, scattered wheezes  Abdominal: Soft. Bowel sounds are normal. There is tenderness in the periumbilical area. There is no rebound and no guarding.  Mild TTP to the periumbilical region  Musculoskeletal: Normal range of motion.  Neurological: She is alert and oriented to person, place, and time.  Skin: Skin is warm and dry.  Psychiatric: She has a normal mood  and affect. Her behavior is normal.  Nursing note and vitals reviewed.  ED Treatments / Results  DIAGNOSTIC STUDIES: Oxygen Saturation is 99% on RA, normal by my interpretation.    COORDINATION OF CARE: 7:16 PM-Discussed treatment plan which includes labs with pt at bedside and pt agreed to plan.    Labs (all labs ordered are listed, but only abnormal results are displayed) Labs Reviewed  URINALYSIS, ROUTINE W REFLEX  MICROSCOPIC (NOT AT [facility]) - Abnormal; Notable for the following:       Result Value   Color, Urine AMBER (*)    APPearance CLOUDY (*)    Bilirubin Urine SMALL (*)    Ketones, ur 15 (*)    Leukocytes, UA SMALL (*)    All other components within normal limits  URINE MICROSCOPIC-ADD ON - Abnormal; Notable for the following:    Squamous Epithelial / LPF 6-30 (*)    Bacteria, UA MANY (*)    All other components within normal limits  COMPREHENSIVE METABOLIC PANEL - Abnormal; Notable for the following:    Calcium 8.8 (*)    All other components within normal limits  CBC - Abnormal; Notable for the following:    WBC 14.1 (*)    Hemoglobin 16.1 (*)    RDW 11.4 (*)    All other components within normal limits  PREGNANCY, URINE  LIPASE, BLOOD    EKG  EKG Interpretation None      Radiology Ct Abdomen Pelvis W Contrast  Result Date: 05/04/2016 CLINICAL DATA:  30 year old female with periumbilical pain. Evaluate for appendicitis. EXAM: CT ABDOMEN AND PELVIS WITH CONTRAST TECHNIQUE: Multidetector CT imaging of the abdomen and pelvis was performed using the standard protocol following bolus administration of intravenous contrast. CONTRAST:  ISOVUE-300 IOPAMIDOL (ISOVUE-300) INJECTION 61% COMPARISON:  None. FINDINGS: Lower chest: The visualized lung bases are clear. No intra-abdominal free air.  Trace free fluid within the pelvis. Hepatobiliary: Apparent mild fatty infiltration of the liver. The liver is otherwise unremarkable. No intrahepatic biliary ductal dilatation. The gallbladder is unremarkable. Pancreas: Unremarkable. No pancreatic ductal dilatation or surrounding inflammatory changes. Spleen: Normal in size without focal abnormality. Adrenals/Urinary Tract: Adrenal glands are unremarkable. Kidneys are normal, without renal calculi, focal lesion, or hydronephrosis. Bladder is unremarkable. Stomach/Bowel: Oral contrast opacifies multiple loops of small bowel and extends into the terminal  ileum at the level of the ileocecal valve. Multiple mildly thickened and inflamed loops of small bowel noted in the right hemipelvis most compatible with enteritis. There may be mild associated ileus. No definite evidence of bowel obstruction. Loose stool noted throughout the colon compatible with diarrheal state. Correlation with clinical exam and stool cultures recommended. The appendix is normal. Vascular/Lymphatic: No significant vascular findings are present. No enlarged abdominal or pelvic lymph nodes. Reproductive: The uterus is anteverted. Bilateral dominant ovarian follicles/cysts measuring 3.7 cm on the right. Other: None Musculoskeletal: No acute or significant osseous findings. IMPRESSION: Inflammatory changes of multiple mid and distal small bowel loops compatible with enteritis. No definite evidence of bowel obstruction. Normal appendix. Diarrheal state. Correlation with clinical exam and stool cultures recommended. Electronically Signed   By: Elgie Collard M.D.   On: 05/04/2016 21:54    Procedures Procedures (including critical care time)  Medications Ordered in ED Medications  ondansetron (ZOFRAN) injection 4 mg (4 mg Intravenous Given 05/04/16 1814)  acetaminophen (TYLENOL) tablet 650 mg (650 mg Oral Given 05/04/16 1921)  iopamidol (ISOVUE-300) 61 % injection 100 mL (100 mLs Intravenous Contrast Given 05/04/16 2103)  Initial Impression / Assessment and Plan / ED Course  I have reviewed the triage vital signs and the nursing notes.  Pertinent labs & imaging results that were available during my care of the patient were reviewed by me and considered in my medical decision making (see chart for details).  Clinical Course   BP 126/89   Pulse 109   Temp 98.3 F (36.8 C) (Oral)   Resp 22   Ht 5\' 4"  (1.626 m)   Wt 81.6 kg   LMP 04/01/2016   SpO2 96%   BMI 30.90 kg/m   Patient is nontoxic, nonseptic appearing, in no apparent distress.  Patient's pain and other symptoms  adequately managed in emergency department.  Fluid bolus given.  Labs, imaging and vitals reviewed.  Patient does not meet the SIRS or Sepsis criteria.  On repeat exam patient does not have a surgical abdomin and there are no peritoneal signs.  No indication of appendicitis, bowel obstruction, bowel perforation, cholecystitis, diverticulitis, PID or ectopic pregnancy. Abd/pelvis CT showing evidence of enteritis. Will provide sxs treatment.  No dysuria, vaginal bleeding or vaginal discharge. Patient discharged home with symptomatic treatment and given strict instructions for follow-up with their primary care physician.  I have also discussed reasons to return immediately to the ER.  Patient expresses understanding and agrees with plan.  Final Clinical Impressions(s) / ED Diagnoses   Final diagnoses:  Enteritis   I personally performed the services described in this documentation, which was scribed in my presence. The recorded information has been reviewed and is accurate.     New Prescriptions New Prescriptions   POLYETHYLENE GLYCOL (MIRALAX / GLYCOLAX) PACKET    Take 17 g by mouth daily.   PROMETHAZINE (PHENERGAN) 25 MG TABLET    Take 1 tablet (25 mg total) by mouth every 6 (six) hours as needed for nausea.     Sheena Helper, PA-C 05/04/16 1610    Rolan Bucco, MD 05/04/16 2318

## 2016-05-04 NOTE — ED Triage Notes (Signed)
C/o abd pain, n/v/d x today-NAD-steady gait

## 2017-10-23 IMAGING — CT CT ABD-PELV W/ CM
2 of 4 series · 16 of 46 positions shown, 18 images · IV contrast (APPLIED)
Comparison: None.

CLINICAL DATA: 30-year-old female with periumbilical pain. Evaluate
for appendicitis.

EXAM:
CT ABDOMEN AND PELVIS WITH CONTRAST
TECHNIQUE: Multidetector CT imaging of the abdomen and pelvis was performed
using the standard protocol following bolus administration of
intravenous contrast.
CONTRAST:  100mL [id number] IOPAMIDOL (MXQ37Z-922) INJECTION 61%

[Series 2: axial st · axial · 0.79mm/px · z∈[-383,+32]mm · 13 of 91 slices shown, 15 images]
[im 4/91  soft-tissue]
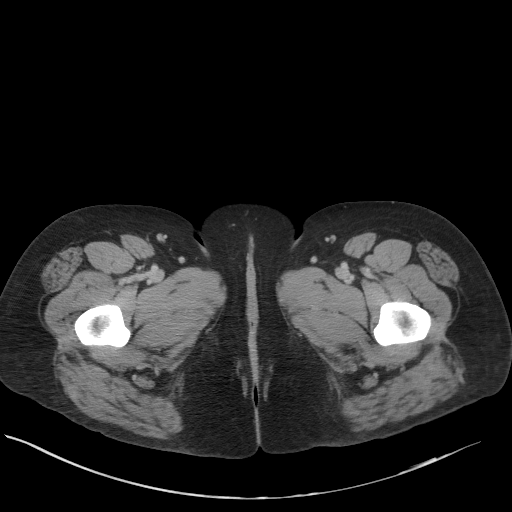
[im 4/91  bone]
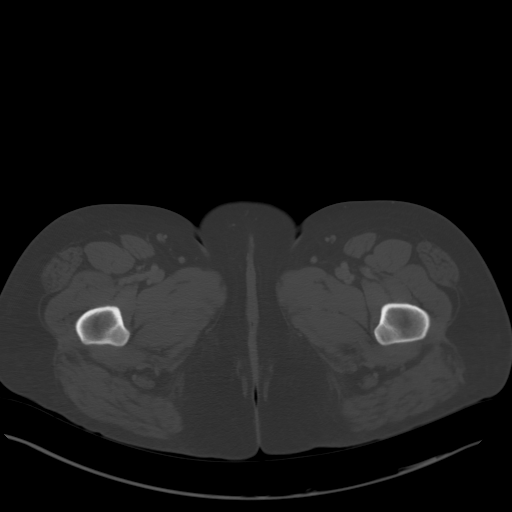
[im 11/91  soft-tissue]
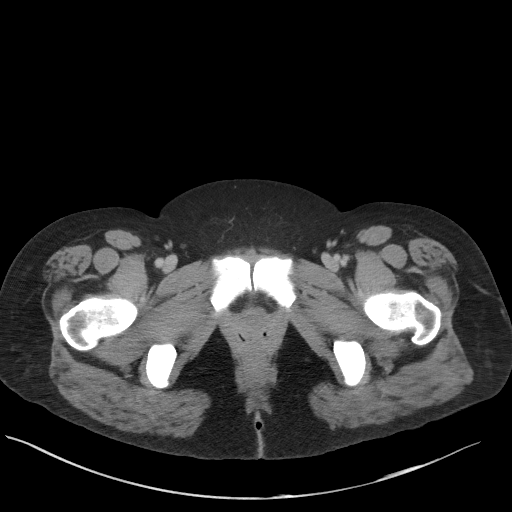
[im 19/91  soft-tissue]
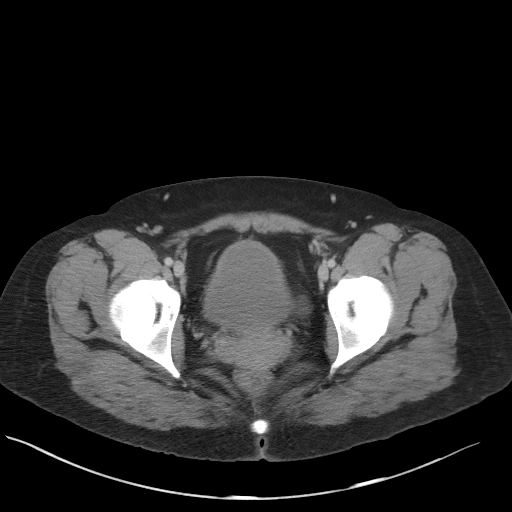
[im 26/91  soft-tissue]
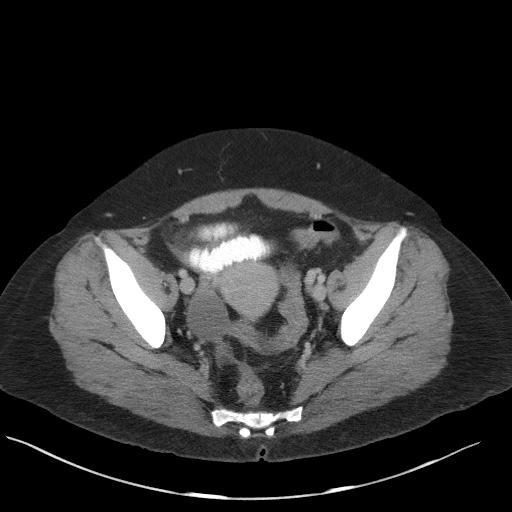
[im 33/91  soft-tissue]
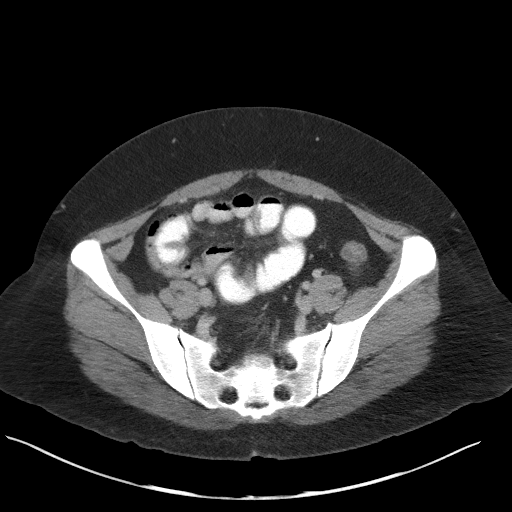
[im 40/91  soft-tissue]
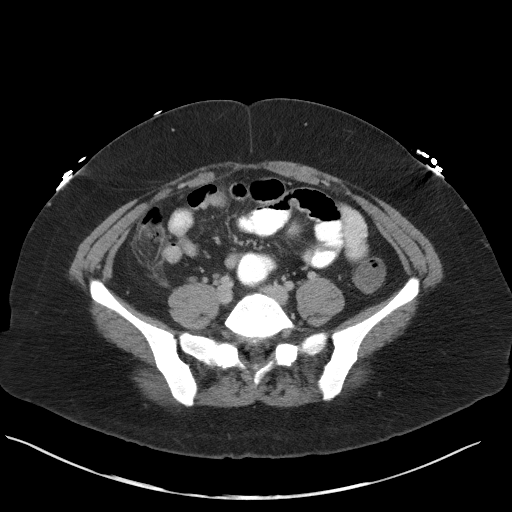
[im 47/91  soft-tissue]
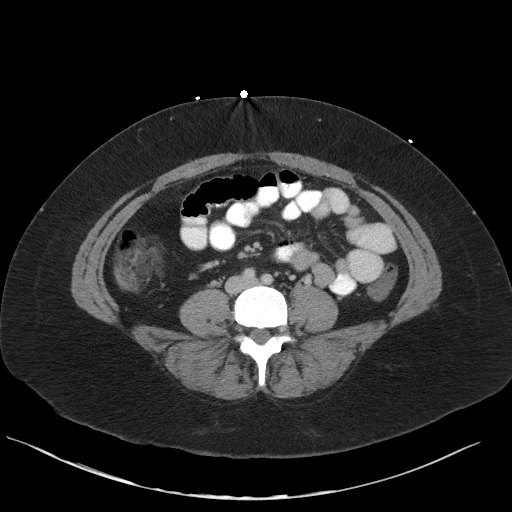
[im 51/91  soft-tissue]
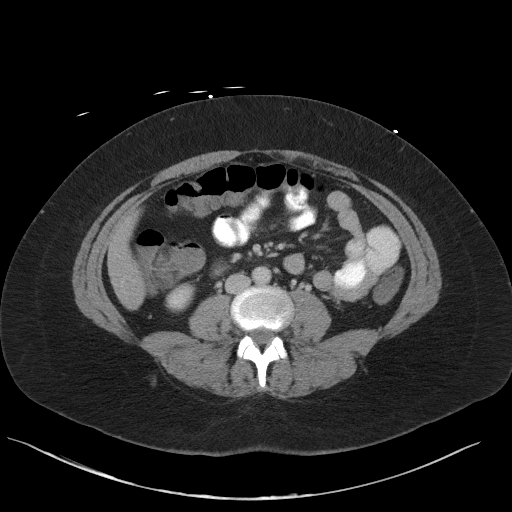
[im 58/91  soft-tissue]
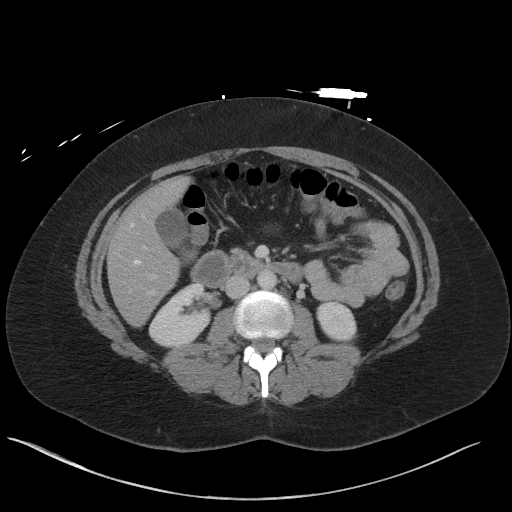
[im 58/91  bone]
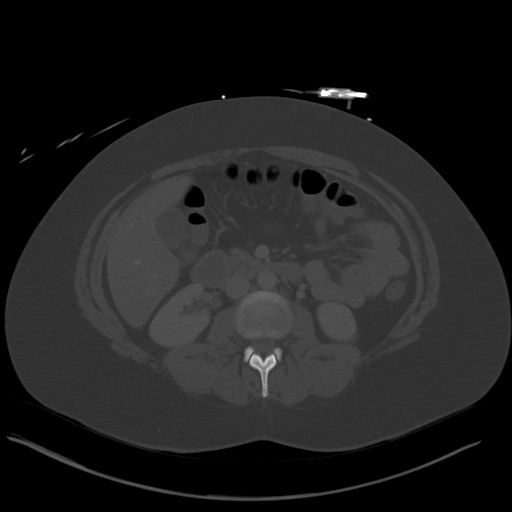
[im 65/91  soft-tissue]
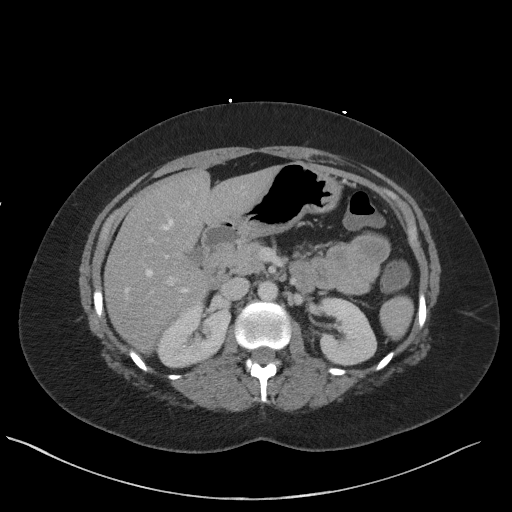
[im 73/91  soft-tissue]
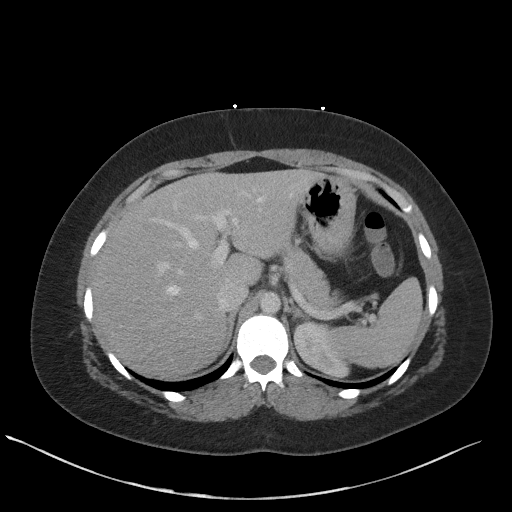
[im 80/91  soft-tissue]
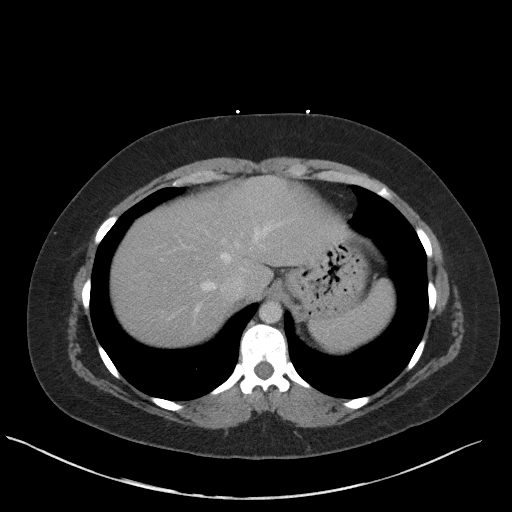
[im 87/91  soft-tissue]
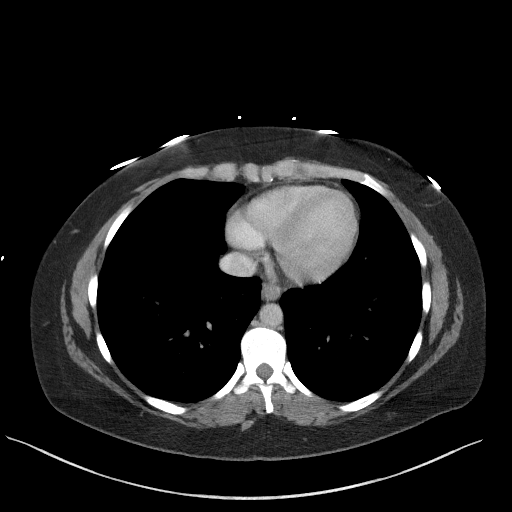

[Series 5: coronal st · coronal · 0.77mm/px · 3 of 87 slices shown]
[im 29/87  soft-tissue]
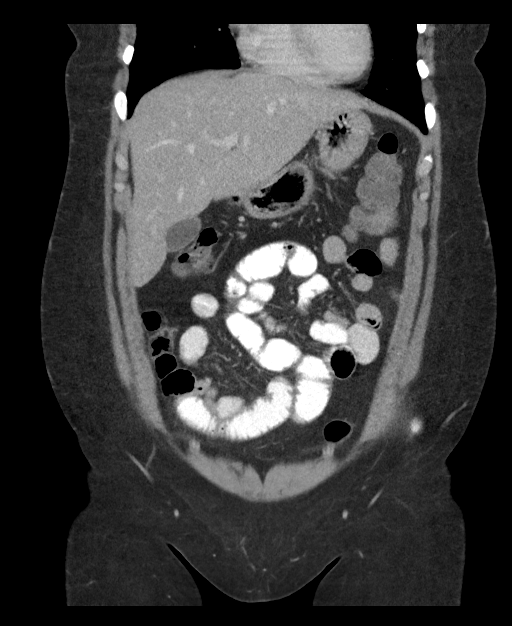
[im 39/87  soft-tissue]
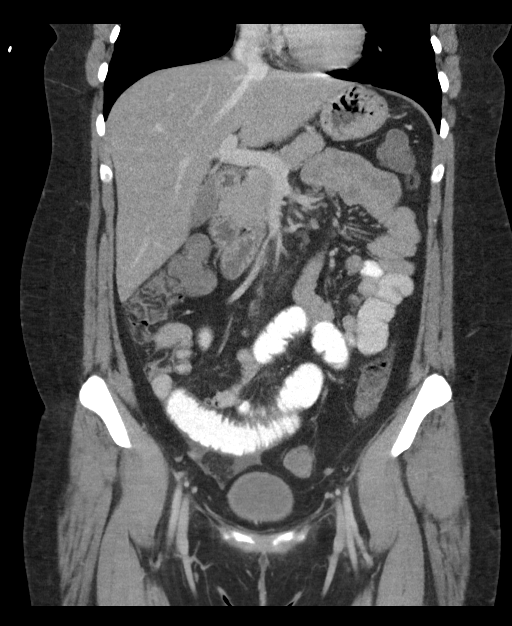
[im 48/87  soft-tissue]
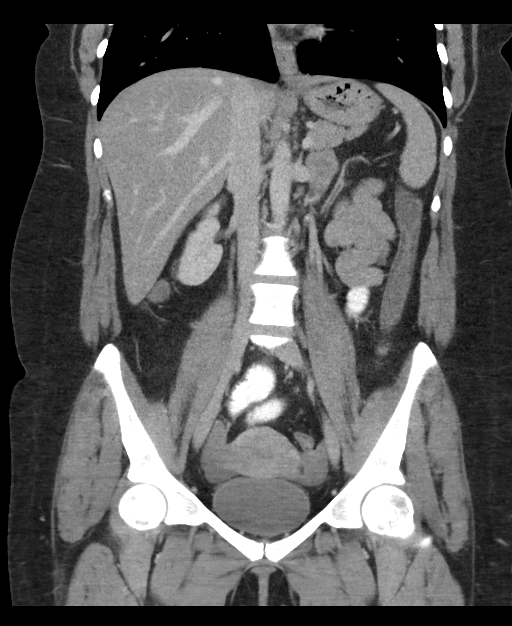

[16 of 46 positions shown; findings below may reference images not displayed]

FINDINGS: Lower chest: The visualized lung bases are clear.

No intra-abdominal free air.  Trace free fluid within the pelvis.

Hepatobiliary: Apparent mild fatty infiltration of the liver. The
liver is otherwise unremarkable. No intrahepatic biliary ductal
dilatation. The gallbladder is unremarkable.

Pancreas: Unremarkable. No pancreatic ductal dilatation or
surrounding inflammatory changes.

Spleen: Normal in size without focal abnormality.

Adrenals/Urinary Tract: Adrenal glands are unremarkable. Kidneys are
normal, without renal calculi, focal lesion, or hydronephrosis.
Bladder is unremarkable.

Stomach/Bowel: Oral contrast opacifies multiple loops of small bowel
and extends into the terminal ileum at the level of the ileocecal
valve. Multiple mildly thickened and inflamed loops of small bowel
noted in the right hemipelvis most compatible with enteritis. There
may be mild associated ileus. No definite evidence of bowel
obstruction. Loose stool noted throughout the colon compatible with
diarrheal state. Correlation with clinical exam and stool cultures
recommended. The appendix is normal.

Vascular/Lymphatic: No significant vascular findings are present. No
enlarged abdominal or pelvic lymph nodes.

Reproductive: The uterus is anteverted. Bilateral dominant ovarian
follicles/cysts measuring 3.7 cm on the right.

Other: None

Musculoskeletal: No acute or significant osseous findings.
IMPRESSION: Inflammatory changes of multiple mid and distal small bowel loops
compatible with enteritis. No definite evidence of bowel
obstruction. Normal appendix.

Diarrheal state. Correlation with clinical exam and stool cultures
recommended.

## 2019-08-04 NOTE — L&D Delivery Note (Signed)
OB/GYN Faculty Practice Delivery Note  Sheena [name] is a 34 y.o. [phone] s/p SVD at [redacted]w[redacted]d. She was admitted for IOL for PEC w SF.   ROM: 0h 89m with clear fluid GBS Status: neg   Maximum Maternal Temperature: 98.9  Labor Progress: . Admitted to L&D and started on magnesium for PEC w SF. Initial SVE: 1cm. She received a FB and two doses of cytotec, then transitioned to pitocin. SROM at 0600 and she then progressed to complete.   Delivery Date/Time: 11/15 at [phone] Delivery: Called to room and patient was complete. Upon arrival to room patient pushed with one contraction to deliver newborn. Head delivered LOA. No nuchal cord present. Shoulder and body delivered in usual fashion. Infant with spontaneous cry, placed on mother's abdomen, dried and stimulated. Cord clamped x 2 after 1-minute delay, and cut by FOB. Cord blood drawn. Placenta delivered spontaneously with gentle cord traction. Fundus firm with massage and Pitocin. Labia, perineum, vagina, and cervix inspected inspected with right labial superficial abrasion, not requiring repair.   Baby Weight: pending  Placenta: Sent to L&D Complications: None Lacerations: right labial superficial, not repaired EBL: 100 mL Analgesia: Epidural   Infant:  APGAR (1 MIN): 8   APGAR (5 MINS): 9   APGAR (10 MINS):     Casper Harrison, MD [facility] Family Medicine Fellow, [facility], [facility] 06/17/2020, 7:11 AM

## 2020-02-14 ENCOUNTER — Encounter (HOSPITAL_COMMUNITY): Payer: Self-pay | Admitting: Emergency Medicine

## 2020-02-14 ENCOUNTER — Other Ambulatory Visit: Payer: Self-pay

## 2020-02-14 ENCOUNTER — Emergency Department (HOSPITAL_COMMUNITY)
Admission: EM | Admit: 2020-02-14 | Discharge: 2020-02-14 | Disposition: A | Discharge status: Home / Self Care | Payer: Medicaid Other | Attending: Emergency Medicine | Admitting: Emergency Medicine

## 2020-02-14 ENCOUNTER — Emergency Department (HOSPITAL_COMMUNITY): Payer: Medicaid Other

## 2020-02-14 DIAGNOSIS — R109 Unspecified abdominal pain: Secondary | ICD-10-CM

## 2020-02-14 DIAGNOSIS — R140 Abdominal distension (gaseous): Secondary | ICD-10-CM | POA: Insufficient documentation

## 2020-02-14 DIAGNOSIS — R197 Diarrhea, unspecified: Secondary | ICD-10-CM | POA: Insufficient documentation

## 2020-02-14 DIAGNOSIS — O26892 Other specified pregnancy related conditions, second trimester: Secondary | ICD-10-CM | POA: Diagnosis not present

## 2020-02-14 DIAGNOSIS — F17210 Nicotine dependence, cigarettes, uncomplicated: Secondary | ICD-10-CM | POA: Insufficient documentation

## 2020-02-14 DIAGNOSIS — Z3A18 18 weeks gestation of pregnancy: Secondary | ICD-10-CM | POA: Insufficient documentation

## 2020-02-14 DIAGNOSIS — R110 Nausea: Secondary | ICD-10-CM | POA: Insufficient documentation

## 2020-02-14 HISTORY — DX: Pre-existing hypertension with pre-eclampsia, unspecified trimester: O11.9

## 2020-02-14 HISTORY — DX: Gastro-esophageal reflux disease without esophagitis: K21.9

## 2020-02-14 LAB — CBC WITH DIFFERENTIAL/PLATELET
Abs Immature Granulocytes: 0.04 10*3/uL (ref 0.00–0.07)
Basophils Absolute: 0.1 10*3/uL (ref 0.0–0.1)
Basophils Relative: 1 %
Eosinophils Absolute: 0.2 10*3/uL (ref 0.0–0.5)
Eosinophils Relative: 1 %
HCT: 35.5 % — ABNORMAL LOW (ref 36.0–46.0)
Hemoglobin: 12.8 g/dL (ref 12.0–15.0)
Immature Granulocytes: 0 %
Lymphocytes Relative: 26 %
Lymphs Abs: 2.9 10*3/uL (ref 0.7–4.0)
MCH: 33.9 pg (ref 26.0–34.0)
MCHC: 36.1 g/dL — ABNORMAL HIGH (ref 30.0–36.0)
MCV: 93.9 fL (ref 80.0–100.0)
Monocytes Absolute: 0.5 10*3/uL (ref 0.1–1.0)
Monocytes Relative: 5 %
Neutro Abs: 7.4 10*3/uL (ref 1.7–7.7)
Neutrophils Relative %: 67 %
Platelets: 299 10*3/uL (ref 150–400)
RBC: 3.78 MIL/uL — ABNORMAL LOW (ref 3.87–5.11)
RDW: 12.7 % (ref 11.5–15.5)
WBC: 11.1 10*3/uL — ABNORMAL HIGH (ref 4.0–10.5)
nRBC: 0 % (ref 0.0–0.2)

## 2020-02-14 LAB — URINALYSIS, ROUTINE W REFLEX MICROSCOPIC
Bacteria, UA: NONE SEEN
Glucose, UA: NEGATIVE mg/dL
Hgb urine dipstick: NEGATIVE
Ketones, ur: 5 mg/dL — AB
Leukocytes,Ua: NEGATIVE
Nitrite: NEGATIVE
Protein, ur: 30 mg/dL — AB
Specific Gravity, Urine: 1.024 (ref 1.005–1.030)
pH: 6 (ref 5.0–8.0)

## 2020-02-14 LAB — BASIC METABOLIC PANEL
Anion gap: 9 (ref 5–15)
BUN: 5 mg/dL — ABNORMAL LOW (ref 6–20)
CO2: 23 mmol/L (ref 22–32)
Calcium: 9 mg/dL (ref 8.9–10.3)
Chloride: 102 mmol/L (ref 98–111)
Creatinine, Ser: 0.47 mg/dL (ref 0.44–1.00)
GFR calc Af Amer: >60 mL/min (ref >60)
GFR calc non Af Amer: >60 mL/min (ref >60)
Glucose, Bld: 93 mg/dL (ref 70–99)
Potassium: 3.4 mmol/L — ABNORMAL LOW (ref 3.5–5.1)
Sodium: 134 mmol/L — ABNORMAL LOW (ref 135–145)

## 2020-02-14 LAB — POC URINE PREG, ED: Preg Test, Ur: POSITIVE — AB

## 2020-02-14 LAB — HCG, SERUM, QUALITATIVE: Preg, Serum: POSITIVE — AB

## 2020-02-14 LAB — HCG, QUANTITATIVE, PREGNANCY: hCG, Beta Chain, Quant, S: 36753 m[IU]/mL — ABNORMAL HIGH (ref <5)

## 2020-02-14 MED ORDER — DOXYLAMINE-PYRIDOXINE 10-10 MG PO TBEC: 2.0000 | DELAYED_RELEASE_TABLET | Freq: Every day | ORAL | 0 refills | Status: DC

## 2020-02-14 MED ORDER — PROMETHAZINE HCL 25 MG/ML IJ SOLN
12.5000 mg | Freq: Once | INTRAMUSCULAR | Status: AC
  Administered 2020-02-14: 12.5 mg via INTRAVENOUS
  Filled 2020-02-14: qty 1

## 2020-02-14 MED ORDER — SODIUM CHLORIDE 0.9 % IV BOLUS
1000.0000 mL | Freq: Once | INTRAVENOUS | Status: AC
  Administered 2020-02-14: 1000 mL via INTRAVENOUS

## 2020-02-14 NOTE — ED Notes (Signed)
Patient observed walking out of hospital after discharge with red tourniquet from ED. 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

## 2020-02-14 NOTE — ED Notes (Signed)
Patient transported to Ultrasound 

## 2020-02-14 NOTE — ED Triage Notes (Signed)
Pt c/o of abdominal pain with N/V for the last 4 months. Pt states she has irregular menstruation but states the last one she remembers was in March. Pt states she performed an at home pregnancy test last month and it was positive. Pt says she smokes and drinks excessively.

## 2020-02-14 NOTE — ED Provider Notes (Signed)
[facility] EMERGENCY DEPARTMENT Provider Note   CSN: [id number] Arrival date & time: 02/14/20  1310     History Chief Complaint  Patient presents with  . Abdominal Pain    Sheena [name] is a 34 y.o. female  with prior history of GERD and preeclampsia presented with complaints of nausea without emesis which has been intermittent for the past 4 months.  She also reports having a regular menses until March, which was her last period which she just assumed was secondary to stress with unexpected death of her 1st child's father, however she took a home pregnancy test last month and it was positive.  She denies abdominal pain but has had abdominal distention and felt her abdomen "drop" recently.  Additionally has noted breast tenderness and beginning of milk production..  She is concerned about this pregnancy as she routinely consumes alcohol, last intake was a week ago.  She also uses occasional Xanax which is not prescribed for anxiety and smokes marijuana and cigarettes on a routine basis.  She has not established care with an obstetrician, has seen Dr. Emelda Fear in the past.  She denies fevers or chills.  She denies abdominal pain.  She does endorse diarrhea for more than a year, stating routinely has a nonbloody watery stool 2-3 times daily.  She denies vaginal pain, discharge or bleeding.  The history is provided by the patient.       Past Medical History:  Diagnosis Date  . GERD (gastroesophageal reflux disease)   . Preeclampsia   . Preeclampsia complicating hypertension     There are no problems to display for this patient.   History reviewed. No pertinent surgical history.   OB History   No obstetric history on file.     No family history on file.  Social History   Tobacco Use  . Smoking status: Current Every Day Smoker    Packs/day: 2.00    Types: Cigarettes  . Smokeless tobacco: Never Used  Substance Use Topics  . Alcohol use: Yes    Alcohol/week: 12.0  standard drinks    Types: 12 Cans of beer per week    Comment: daily  . Drug use: Yes    Frequency: 4.0 times per week    Types: Marijuana    Comment: benzo use    Home Medications Prior to Admission medications   Medication Sig Start Date End Date Taking? Authorizing Provider  omeprazole (PRILOSEC) 20 MG capsule Take 20 mg by mouth in the morning.   Yes [provider]  Prenatal Vit-Fe Fumarate-FA (PRENATAL MULTIVITAMIN) TABS tablet Take 1 tablet by mouth daily at 12 noon.   Yes [provider]  Doxylamine-Pyridoxine 10-10 MG TBEC Take 2 tablets by mouth at bedtime. 02/14/20   Burgess Amor, PA-C    Allergies    Patient has no known allergies.  Review of Systems   Review of Systems  Constitutional: Negative for chills and fever.  HENT: Negative for congestion and sore throat.   Eyes: Negative.   Respiratory: Negative for chest tightness and shortness of breath.   Cardiovascular: Negative for chest pain.  Gastrointestinal: Positive for abdominal distention, diarrhea and nausea. Negative for abdominal pain.  Genitourinary: Negative.  Negative for vaginal discharge and vaginal pain.  Musculoskeletal: Negative for arthralgias, joint swelling and neck pain.  Skin: Negative.  Negative for rash and wound.  Neurological: Negative for dizziness, weakness, light-headedness, numbness and headaches.  Psychiatric/Behavioral: Negative.     Physical Exam Updated Vital Signs  BP (!) 137/93   Pulse 84   Temp 98.6 F (37 C) (Oral)   Resp 16   Ht 5\' 4"  (1.626 m)   Wt 68 kg   LMP 10/02/2019 (Approximate)   SpO2 100%   BMI 25.75 kg/m   Physical Exam Vitals and nursing note reviewed.  Constitutional:      Appearance: She is well-developed.  HENT:     Head: Normocephalic and atraumatic.  Eyes:     Conjunctiva/sclera: Conjunctivae normal.  Cardiovascular:     Rate and Rhythm: Normal rate and regular rhythm.     Heart sounds: Normal heart sounds.  Pulmonary:      Effort: Pulmonary effort is normal.     Breath sounds: Normal breath sounds. No wheezing.  Abdominal:     General: Bowel sounds are normal.     Palpations: Abdomen is soft.     Tenderness: There is no abdominal tenderness.  Genitourinary:    Comments: Pelvic exam deferred. No vaginal/pelvic complaints. Musculoskeletal:        General: Normal range of motion.     Cervical back: Normal range of motion.  Skin:    General: Skin is warm and dry.  Neurological:     Mental Status: She is alert.     ED Results / Procedures / Treatments   Labs (all labs ordered are listed, but only abnormal results are displayed) Labs Reviewed  URINALYSIS, ROUTINE W REFLEX MICROSCOPIC - Abnormal; Notable for the following components:      Result Value   Color, Urine AMBER (*)    APPearance CLOUDY (*)    Bilirubin Urine SMALL (*)    Ketones, ur 5 (*)    Protein, ur 30 (*)    All other components within normal limits  HCG, SERUM, QUALITATIVE - Abnormal; Notable for the following components:   Preg, Serum POSITIVE (*)    All other components within normal limits  CBC WITH DIFFERENTIAL/PLATELET - Abnormal; Notable for the following components:   WBC 11.1 (*)    RBC 3.78 (*)    HCT 35.5 (*)    MCHC 36.1 (*)    All other components within normal limits  BASIC METABOLIC PANEL - Abnormal; Notable for the following components:   Sodium 134 (*)    Potassium 3.4 (*)    BUN 5 (*)    All other components within normal limits  HCG, QUANTITATIVE, PREGNANCY - Abnormal; Notable for the following components:   hCG, Beta Chain, Quant, S M (*)    All other components within normal limits  POC URINE PREG, ED - Abnormal; Notable for the following components:   Preg Test, Ur POSITIVE (*)    All other components within normal limits    EKG None  Radiology 69,678 OB Limited  Addendum Date: 02/15/2020   ADDENDUM REPORT: 02/15/2020 08:00 ADDENDUM: There is a transcriptional error within the Impression section  of the report which listed an incorrect gestational age. The correct gestational age was documented within the Findings section of the original report. Impression point #1 should state: "Single live intrauterine gestation in breech presentation measuring 18 weeks 1 day by biparietal diameter." Electronically Signed   By: 02/17/2020 D.O.   On: 02/15/2020 08:00   Result Date: 02/15/2020 CLINICAL DATA:  Abdominal pain, vomiting.  Pregnancy. EXAM: LIMITED OBSTETRIC ULTRASOUND FINDINGS: Number of Fetuses: 1 Heart Rate:  157 bpm Movement: Yes Presentation: Breech Previa: No Placental Location: Anterior Amniotic Fluid (Subjective): Normal BPD:  4.0cm 18w 1d  Maternal Findings: Cervix:  Closed Uterus/Adnexae: No abnormality visualized. IMPRESSION: 1. Single live intrauterine gestation in breech presentation measuring 8 weeks 1 day by biparietal diameter. 2. Follow-up for fetal anatomic survey is recommended between 20-22 weeks of gestation. Electronically Signed: By: Duanne Guess D.O. On: 02/14/2020 17:01    Procedures Procedures (including critical care time)  Medications Ordered in ED Medications  sodium chloride 0.9 % bolus 1,000 mL (0 mLs Intravenous Stopped 02/14/20 1826)  promethazine (PHENERGAN) injection 12.5 mg (12.5 mg Intravenous Given 02/14/20 1559)    ED Course  I have reviewed the triage vital signs and the nursing notes.  Pertinent labs & imaging results that were available during my care of the patient were reviewed by me and considered in my medical decision making (see chart for details).    MDM Rules/Calculators/A&P                          Pt with intrauterine pregnancy 18 weeks, fetal heart rate normal.  No prenatal care, denies vaginal discharge, bleeding.  No abdominal pain, has had nausea.  She was prescribed diclegis for nausea.  Discussed safe practices including no etoh which pt endorses, states unable to stop smoking cigarettes however. Referral given to Dr. Emelda Fear  to establish prenatal care.  She has started taking prenatal vitamins, encouraged to continue.  Final Clinical Impression(s) / ED Diagnoses Final diagnoses:  [redacted] weeks gestation of pregnancy    Rx / DC Orders ED Discharge Orders         Ordered    Doxylamine-Pyridoxine 10-10 MG TBEC  Daily at bedtime     Discontinue  Reprint     02/14/20 1806           Burgess Amor, Cordelia Poche 02/15/20 0845    Pricilla Loveless, MD 02/15/20 1759

## 2020-02-14 NOTE — Discharge Instructions (Addendum)
Your lab tests and ultrasound today reflect that you are 18 weeks and 1 day in your pregnancy.  Continue using your prenatal vitamins.  I have also prescribed you medicine to help you with nausea and vomiting.  Call Dr. Emelda Fear to establish [facility] care.

## 2020-02-14 NOTE — ED Notes (Signed)
Pt has multiple scabs in various stages of healing on both arms in addition to scars. Pt denies drug use.

## 2020-02-16 ENCOUNTER — Telehealth: Payer: Self-pay | Admitting: Adult Health

## 2020-02-16 NOTE — Telephone Encounter (Signed)
Pt having nausea and diarrhea. Is pregnant. I advised to take Imodium for diarrhea and Unisom and Vit B6 for nausea. Gave instructions on how to take meds. Pt voiced understanding. JSY

## 2020-02-16 NOTE — Telephone Encounter (Signed)
Patient called stating that she would like one of our provider to call her in some nausea medication. Pt has not seen [place] yet, pt had her pregnancy confirmed @ [facility] and will be coming in next week Tuesday for a dating [place]. Pt states that she uses [facility] in [place]. Please contact pt when and if medication can be called in.

## 2020-02-19 ENCOUNTER — Other Ambulatory Visit: Payer: Self-pay | Admitting: Obstetrics and Gynecology

## 2020-02-19 DIAGNOSIS — Z363 Encounter for antenatal screening for malformations: Secondary | ICD-10-CM

## 2020-02-20 ENCOUNTER — Ambulatory Visit (INDEPENDENT_AMBULATORY_CARE_PROVIDER_SITE_OTHER): Payer: Medicaid Other

## 2020-02-20 DIAGNOSIS — Z3A18 18 weeks gestation of pregnancy: Secondary | ICD-10-CM

## 2020-02-20 DIAGNOSIS — Z363 Encounter for antenatal screening for malformations: Secondary | ICD-10-CM | POA: Diagnosis not present

## 2020-02-20 NOTE — Progress Notes (Signed)
[place] 18+2 wks,breech,cx 3.5 cm,svp of fluid 4.7 cm,thin anterior/fundal placenta gr 1,placenta thickness 1.3 cm,2VC,absent left umbilical artery,fhr 157 bpm,normal ovaries,EFW 245 g,EDD 07/21/2020 (unknown LMP)

## 2020-03-06 ENCOUNTER — Other Ambulatory Visit (HOSPITAL_COMMUNITY)
Admission: RE | Admit: 2020-03-06 | Discharge: 2020-03-06 | Disposition: A | Discharge status: Home / Self Care | Payer: Medicaid Other | Source: Ambulatory Visit | Attending: Obstetrics & Gynecology | Admitting: Obstetrics & Gynecology

## 2020-03-06 ENCOUNTER — Encounter: Payer: Self-pay | Admitting: Women's Health

## 2020-03-06 ENCOUNTER — Ambulatory Visit (INDEPENDENT_AMBULATORY_CARE_PROVIDER_SITE_OTHER): Payer: Medicaid Other | Admitting: Women's Health

## 2020-03-06 ENCOUNTER — Ambulatory Visit: Payer: Medicaid Other | Admitting: *Deleted

## 2020-03-06 VITALS — BP 117/85 | HR 99 | Wt 151.0 lb

## 2020-03-06 DIAGNOSIS — Z331 Pregnant state, incidental: Secondary | ICD-10-CM

## 2020-03-06 DIAGNOSIS — B009 Herpesviral infection, unspecified: Secondary | ICD-10-CM | POA: Insufficient documentation

## 2020-03-06 DIAGNOSIS — Z1389 Encounter for screening for other disorder: Secondary | ICD-10-CM | POA: Diagnosis not present

## 2020-03-06 DIAGNOSIS — Z3482 Encounter for supervision of other normal pregnancy, second trimester: Secondary | ICD-10-CM | POA: Diagnosis not present

## 2020-03-06 DIAGNOSIS — Z3A20 20 weeks gestation of pregnancy: Secondary | ICD-10-CM

## 2020-03-06 DIAGNOSIS — Z8759 Personal history of other complications of pregnancy, childbirth and the puerperium: Secondary | ICD-10-CM

## 2020-03-06 DIAGNOSIS — Z3480 Encounter for supervision of other normal pregnancy, unspecified trimester: Secondary | ICD-10-CM | POA: Diagnosis present

## 2020-03-06 DIAGNOSIS — O0990 Supervision of high risk pregnancy, unspecified, unspecified trimester: Secondary | ICD-10-CM | POA: Insufficient documentation

## 2020-03-06 DIAGNOSIS — Z789 Other specified health status: Secondary | ICD-10-CM | POA: Insufficient documentation

## 2020-03-06 DIAGNOSIS — F17200 Nicotine dependence, unspecified, uncomplicated: Secondary | ICD-10-CM | POA: Diagnosis not present

## 2020-03-06 DIAGNOSIS — F1290 Cannabis use, unspecified, uncomplicated: Secondary | ICD-10-CM

## 2020-03-06 DIAGNOSIS — O0932 Supervision of pregnancy with insufficient antenatal care, second trimester: Secondary | ICD-10-CM

## 2020-03-06 DIAGNOSIS — Q270 Congenital absence and hypoplasia of umbilical artery: Secondary | ICD-10-CM

## 2020-03-06 DIAGNOSIS — O09299 Supervision of pregnancy with other poor reproductive or obstetric history, unspecified trimester: Secondary | ICD-10-CM

## 2020-03-06 LAB — POCT URINALYSIS DIPSTICK OB
Blood, UA: NEGATIVE
Glucose, UA: NEGATIVE
Ketones, UA: NEGATIVE
Leukocytes, UA: NEGATIVE
Nitrite, UA: NEGATIVE
POC,PROTEIN,UA: NEGATIVE

## 2020-03-06 MED ORDER — BLOOD PRESSURE MONITOR MISC: 0 refills | Status: AC

## 2020-03-06 NOTE — Patient Instructions (Signed)
Sheena [name], I greatly value your feedback.  If you receive a survey following your visit with [place] today, we appreciate you taking the time to fill it out.  Thanks, Joellyn Haff, CNM, WHNP-BC  [facility] ([address] [place], [place] [address]) Entrance C, located off of [address] valet parking  Go to [email or web address] to register for FREE online childbirth classes  [place] Pediatricians/Family Doctors:  [facility] [phone]            [facility] [phone]                 [facility] [phone] (usually not accepting new patients unless you have family there already, you are always welcome to call and ask)       [facility] [phone]       [facility] Pediatricians/Family Doctors:   [facility]: [phone]  [facility]: [phone]  [facility]: [phone]  [facility] Doctors:   [facility]: [phone]   [facility]: [phone]  [facility] Doctors:  [facility]: [phone]    Home Blood Pressure Monitoring for Patients   Your provider has recommended that you check your blood pressure (BP) at least once a week at home. If you do not have a blood pressure cuff at home, one will be provided for you. Contact your provider if you have not received your monitor within 1 week.   Helpful Tips for Accurate Home Blood Pressure Checks  . Don't smoke, exercise, or drink caffeine 30 minutes before checking your BP . Use the restroom before checking your BP (a full bladder can raise your pressure) . Relax in a comfortable upright chair . Feet on the ground . Left arm resting comfortably on a flat surface at the level of your heart . Legs uncrossed . Back supported . Sit quietly and don't talk . Place the cuff on your bare arm . Adjust snuggly,  so that only two fingertips can fit between your skin and the top of the cuff . Check 2 readings separated by at least one minute . Keep a log of your BP readings . For a visual, please reference this diagram: http://ccnc.care/bpdiagram  Provider Name: [facility]     Phone: [phone]  Zone 1: ALL CLEAR  Continue to monitor your symptoms:  . BP reading is less than 140 (top number) or less than 90 (bottom number)  . No right upper stomach pain . No headaches or seeing spots . No feeling nauseated or throwing up . No swelling in face and hands  Zone 2: CAUTION Call your doctor's office for any of the following:  . BP reading is greater than 140 (top number) or greater than 90 (bottom number)  . Stomach pain under your ribs in the middle or right side . Headaches or seeing spots . Feeling nauseated or throwing up . Swelling in face and hands  Zone 3: EMERGENCY  Seek immediate medical care if you have any of the following:  . BP reading is greater than160 (top number) or greater than 110 (bottom number) . Severe headaches not improving with Tylenol . Serious difficulty catching your breath . Any worsening symptoms from Zone 2     Second Trimester of Pregnancy The second trimester is from week 14 through week 27 (months 4 through 6). The second trimester is often a time when you feel your  best. Your body has adjusted to being pregnant, and you begin to feel better physically. Usually, morning sickness has lessened or quit completely, you may have more energy, and you may have an increase in appetite. The second trimester is also a time when the fetus is growing rapidly. At the end of the sixth month, the fetus is about 9 inches long and weighs about 1 pounds. You will likely begin to feel the baby move (quickening) between 16 and 20 weeks of pregnancy. Body changes during your second trimester Your body continues to go through many changes during your second trimester. The  changes vary from woman to woman.  Your weight will continue to increase. You will notice your lower abdomen bulging out.  You may begin to get stretch marks on your hips, abdomen, and breasts.  You may develop headaches that can be relieved by medicines. The medicines should be approved by your health care provider.  You may urinate more often because the fetus is pressing on your bladder.  You may develop or continue to have heartburn as a result of your pregnancy.  You may develop constipation because certain hormones are causing the muscles that push waste through your intestines to slow down.  You may develop hemorrhoids or swollen, bulging veins (varicose veins).  You may have back pain. This is caused by: ? Weight gain. ? Pregnancy hormones that are relaxing the joints in your pelvis. ? A shift in weight and the muscles that support your balance.  Your breasts will continue to grow and they will continue to become tender.  Your gums may bleed and may be sensitive to brushing and flossing.  Dark spots or blotches (chloasma, mask of pregnancy) may develop on your face. This will likely fade after the baby is born.  A dark line from your belly button to the pubic area (linea nigra) may appear. This will likely fade after the baby is born.  You may have changes in your hair. These can include thickening of your hair, rapid growth, and changes in texture. Some women also have hair loss during or after pregnancy, or hair that feels dry or thin. Your hair will most likely return to normal after your baby is born.  What to expect at prenatal visits During a routine prenatal visit:  You will be weighed to make sure you and the fetus are growing normally.  Your blood pressure will be taken.  Your abdomen will be measured to track your baby's growth.  The fetal heartbeat will be listened to.  Any test results from the previous visit will be discussed.  Your health care  provider may ask you:  How you are feeling.  If you are feeling the baby move.  If you have had any abnormal symptoms, such as leaking fluid, bleeding, severe headaches, or abdominal cramping.  If you are using any tobacco products, including cigarettes, chewing tobacco, and electronic cigarettes.  If you have any questions.  Other tests that may be performed during your second trimester include:  Blood tests that check for: ? Low iron levels (anemia). ? High blood sugar that affects pregnant women (gestational diabetes) between 71 and 28 weeks. ? Rh antibodies. This is to check for a protein on red blood cells (Rh factor).  Urine tests to check for infections, diabetes, or protein in the urine.  An ultrasound to confirm the proper growth and development of the baby.  An amniocentesis to check for possible genetic problems.  Fetal  screens for spina bifida and Down syndrome.  HIV (human immunodeficiency virus) testing. Routine prenatal testing includes screening for HIV, unless you choose not to have this test.  Follow these instructions at home: Medicines  Follow your health care provider's instructions regarding medicine use. Specific medicines may be either safe or unsafe to take during pregnancy.  Take a prenatal vitamin that contains at least 600 micrograms (mcg) of folic acid.  If you develop constipation, try taking a stool softener if your health care provider approves. Eating and drinking  Eat a balanced diet that includes fresh fruits and vegetables, whole grains, good sources of protein such as meat, eggs, or tofu, and low-fat dairy. Your health care provider will help you determine the amount of weight gain that is right for you.  Avoid raw meat and uncooked cheese. These carry germs that can cause birth defects in the baby.  If you have low calcium intake from food, talk to your health care provider about whether you should take a daily calcium  supplement.  Limit foods that are high in fat and processed sugars, such as fried and sweet foods.  To prevent constipation: ? Drink enough fluid to keep your urine clear or pale yellow. ? Eat foods that are high in fiber, such as fresh fruits and vegetables, whole grains, and beans. Activity  Exercise only as directed by your health care provider. Most women can continue their usual exercise routine during pregnancy. Try to exercise for 30 minutes at least 5 days a week. Stop exercising if you experience uterine contractions.  Avoid heavy lifting, wear low heel shoes, and practice good posture.  A sexual relationship may be continued unless your health care provider directs you otherwise. Relieving pain and discomfort  Wear a good support bra to prevent discomfort from breast tenderness.  Take warm sitz baths to soothe any pain or discomfort caused by hemorrhoids. Use hemorrhoid cream if your health care provider approves.  Rest with your legs elevated if you have leg cramps or low back pain.  If you develop varicose veins, wear support hose. Elevate your feet for 15 minutes, 3-4 times a day. Limit salt in your diet. Prenatal Care  Write down your questions. Take them to your prenatal visits.  Keep all your prenatal visits as told by your health care provider. This is important. Safety  Wear your seat belt at all times when driving.  Make a list of emergency phone numbers, including numbers for family, friends, the hospital, and police and fire departments. General instructions  Ask your health care provider for a referral to a local prenatal education class. Begin classes no later than the beginning of month 6 of your pregnancy.  Ask for help if you have counseling or nutritional needs during pregnancy. Your health care provider can offer advice or refer you to specialists for help with various needs.  Do not use hot tubs, steam rooms, or saunas.  Do not douche or use  tampons or scented sanitary pads.  Do not cross your legs for long periods of time.  Avoid cat litter boxes and soil used by cats. These carry germs that can cause birth defects in the baby and possibly loss of the fetus by miscarriage or stillbirth.  Avoid all smoking, herbs, alcohol, and unprescribed drugs. Chemicals in these products can affect the formation and growth of the baby.  Do not use any products that contain nicotine or tobacco, such as cigarettes and e-cigarettes. If you need help  quitting, ask your health care provider.  Visit your dentist if you have not gone yet during your pregnancy. Use a soft toothbrush to brush your teeth and be gentle when you floss. Contact a health care provider if:  You have dizziness.  You have mild pelvic cramps, pelvic pressure, or nagging pain in the abdominal area.  You have persistent nausea, vomiting, or diarrhea.  You have a bad smelling vaginal discharge.  You have pain when you urinate. Get help right away if:  You have a fever.  You are leaking fluid from your vagina.  You have spotting or bleeding from your vagina.  You have severe abdominal cramping or pain.  You have rapid weight gain or weight loss.  You have shortness of breath with chest pain.  You notice sudden or extreme swelling of your face, hands, ankles, feet, or legs.  You have not felt your baby move in over an hour.  You have severe headaches that do not go away when you take medicine.  You have vision changes. Summary  The second trimester is from week 14 through week 27 (months 4 through 6). It is also a time when the fetus is growing rapidly.  Your body goes through many changes during pregnancy. The changes vary from woman to woman.  Avoid all smoking, herbs, alcohol, and unprescribed drugs. These chemicals affect the formation and growth your baby.  Do not use any tobacco products, such as cigarettes, chewing tobacco, and e-cigarettes. If you  need help quitting, ask your health care provider.  Contact your health care provider if you have any questions. Keep all prenatal visits as told by your health care provider. This is important. This information is not intended to replace advice given to you by your health care provider. Make sure you discuss any questions you have with your health care provider. Document Released: 07/14/2001 Document Revised: 12/26/2015 Document Reviewed: 09/20/2012 Elsevier Interactive Patient Education  2017 [employer].

## 2020-03-06 NOTE — Progress Notes (Signed)
INITIAL OBSTETRICAL VISIT Patient name: Sheena [name] MRN [id number]  Date of birth: [birth year] Chief Complaint:   Initial Prenatal Visit (vomiting, no appetite)  History of Present Illness:   [name] is a 34 y.o. G3P0203 Caucasian female at [redacted]w[redacted]d by [place] at 38 weeks with an Estimated Date of Delivery: 07/21/20 being seen today for her initial obstetrical visit.   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   Today she reports n/v- has diclegis, helps for most part, declines trying other meds right now. Uses THC to help. 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 Smokes 1.5ppd, knows she needs to slow down/quit but not quite ready.  2VC on anatomy u/s 2wks ago Depression screen [facility] 2/9 03/06/2020  Decreased Interest 2  Down, Depressed, Hopeless 1  PHQ - 2 Score 3  Altered sleeping 2  Tired, decreased energy 3  Change in appetite 3  Feeling bad or failure about yourself  2  Trouble concentrating 2  Moving slowly or fidgety/restless 0  Suicidal thoughts 0  PHQ-9 Score 15    No LMP recorded (lmp unknown). Patient is pregnant. Last pap 2010. Results were: normal Review of Systems:   Pertinent items are noted in HPI Denies cramping/contractions, leakage of fluid, vaginal bleeding, abnormal vaginal discharge w/ itching/odor/irritation, headaches, visual changes, shortness of breath, chest pain, abdominal pain, severe nausea/vomiting, or problems with urination or bowel movements unless otherwise stated above.  Pertinent History Reviewed:  Reviewed past medical,surgical, social, obstetrical and family history.  Reviewed problem list, medications and allergies. OB History  Gravida Para Term Preterm AB Living  3 2 0 2   3  SAB TAB Ectopic Multiple Live Births        1 3    # Outcome Date GA Lbr Len/2nd Weight Sex Delivery Anes PTL Lv  3 Current           2A Preterm 09/18/08 [redacted]w[redacted]d  4  lb 15 oz (2.24 kg) M Vag-Spont   LIV  2B Preterm 09/18/08 [redacted]w[redacted]d  5 lb (2.268 kg) M Vag-Spont   LIV  1 Preterm 08/18/05 [redacted]w[redacted]d  5 lb (2.268 kg) F Vag-Spont EPI  LIV     Complications: Preeclampsia   Physical Assessment:   Vitals:   03/06/20 1416  BP: 117/85  Pulse: 99  Weight: 151 lb (68.5 kg)  Body mass index is 25.92 kg/m.       Physical Examination:  General appearance - well appearing, and in no distress  Mental status - alert, oriented to person, place, and time  Psych:  She has a normal mood and affect  Skin - warm and dry, normal color, no suspicious lesions noted  Chest - effort normal, all lung fields clear to auscultation bilaterally  Heart - normal rate and regular rhythm  Abdomen - soft, nontender  Extremities:  No swelling or varicosities noted  Pelvic - VULVA: normal appearing vulva with no masses, tenderness or lesions  VAGINA: normal appearing vagina with normal color and discharge, no lesions  CERVIX: normal appearing cervix without discharge or lesions, no CMT  Thin prep pap is done w/ HR HPV cotesting  TODAY'S FHR: 145 via doppler  Results for orders placed or performed in visit on 03/06/20 (from the past 24 hour(s))  POC Urinalysis Dipstick OB   Collection Time: 03/06/20  2:51 PM  Result Value Ref Range  Color, UA     Clarity, UA     Glucose, UA Negative Negative   Bilirubin, UA     Ketones, UA neg    Spec Grav, UA     Blood, UA neg    pH, UA     POC,PROTEIN,UA Negative Negative, Trace, Small (1+), Moderate (2+), Large (3+), 4+   Urobilinogen, UA     Nitrite, UA neg    Leukocytes, UA Negative Negative   Appearance     Odor      Assessment & Plan:  1) Low-Risk Pregnancy [id number] at [redacted]w[redacted]d with an Estimated Date of Delivery: 07/21/20   2) Initial OB visit  3) 2VC> discussed and gave printed info, plan EFW 28/32/36wk  4) H/O pre-e x 2> baseline labs today, ASA 162mg   5) H/O PPH  6) Heavy ETOH use until June>none since  7) THC use> discussed  potential long term effects to baby, advised cessation  8) Smoker> Smokes 1.5pp/day, counseled x 3-104mins, advised cessation, discussed risks to fetus while pregnant, to infant pp, and to herself. Offered QuitlineNC, declined     Meds:  Meds ordered this encounter  Medications  . Blood Pressure Monitor MISC    Sig: For regular home bp monitoring during pregnancy    Dispense:  1 each    Refill:  0    Z34.82    Initial labs obtained Continue prenatal vitamins Reviewed n/v relief measures and warning s/s to report Reviewed recommended weight gain based on pre-gravid BMI Encouraged well-balanced diet Genetic & carrier screening discussed: requests Panorama, AFP and Horizon 14 , too late for NT/IT Ultrasound discussed; fetal survey: results reviewed [facility]NC completed> form faxed if has or is planning to apply for medicaid The nature of [facility] for [facility] with multiple MDs and other Advanced Practice Providers was explained to patient; also emphasized that fellows, residents, and students are part of our team. Does not have home bp cuff. Rx faxed to [facility]. Check bp weekly, let [employer] know if >140/90.   Follow-up: Return in about 4 weeks (around 04/03/2020) for LROB, CNM, in person.   Orders Placed This Encounter  Procedures  . Urine Culture  . Genetic Screening  . AFP, Serum, Open Spina Bifida  . CBC/D/Plt+RPR+Rh+ABO+Rub Ab...  . Pain Management Screening Profile (10S)  . Comprehensive metabolic panel  . Protein / creatinine ratio, urine  . POC Urinalysis Dipstick OB    06/03/2020 CNM, [facility] 03/06/2020 3:34 PM

## 2020-03-07 LAB — PMP SCREEN PROFILE (10S), URINE
Amphetamine Scrn, Ur: NEGATIVE ng/mL
BARBITURATE SCREEN URINE: NEGATIVE ng/mL
BENZODIAZEPINE SCREEN, URINE: POSITIVE ng/mL — AB
CANNABINOIDS UR QL SCN: POSITIVE ng/mL — AB
Cocaine (Metab) Scrn, Ur: NEGATIVE ng/mL
Creatinine(Crt), U: 268.7 mg/dL (ref 20.0–300.0)
Methadone Screen, Urine: NEGATIVE ng/mL
OXYCODONE+OXYMORPHONE UR QL SCN: NEGATIVE ng/mL
Opiate Scrn, Ur: NEGATIVE ng/mL
Ph of Urine: 6.6 (ref 4.5–8.9)
Phencyclidine Qn, Ur: NEGATIVE ng/mL
Propoxyphene Scrn, Ur: NEGATIVE ng/mL

## 2020-03-08 LAB — AFP, SERUM, OPEN SPINA BIFIDA
AFP MoM: 1.29
AFP Value: 78 ng/mL
Gest. Age on Collection Date: 20.3 weeks
Maternal Age At EDD: 33.4 yr
OSBR Risk 1 IN: 4947
Test Results:: NEGATIVE
Weight: 151 [lb_av]

## 2020-03-08 LAB — CBC/D/PLT+RPR+RH+ABO+RUB AB...
Antibody Screen: NEGATIVE
Basophils Absolute: 0.1 10*3/uL (ref 0.0–0.2)
Basos: 0 %
EOS (ABSOLUTE): 0.2 10*3/uL (ref 0.0–0.4)
Eos: 2 %
HCV Ab: <0.1 s/co ratio (ref 0.0–0.9)
HIV Screen 4th Generation wRfx: NONREACTIVE
Hematocrit: 36.5 % (ref 34.0–46.6)
Hemoglobin: 12.2 g/dL (ref 11.1–15.9)
Hepatitis B Surface Ag: NEGATIVE
Immature Grans (Abs): 0 10*3/uL (ref 0.0–0.1)
Immature Granulocytes: 0 %
Lymphocytes Absolute: 3 10*3/uL (ref 0.7–3.1)
Lymphs: 22 %
MCH: 31.9 pg (ref 26.6–33.0)
MCHC: 33.4 g/dL (ref 31.5–35.7)
MCV: 95 fL (ref 79–97)
Monocytes Absolute: 0.6 10*3/uL (ref 0.1–0.9)
Monocytes: 4 %
Neutrophils Absolute: 9.6 10*3/uL — ABNORMAL HIGH (ref 1.4–7.0)
Neutrophils: 72 %
Platelets: 424 10*3/uL (ref 150–450)
RBC: 3.83 x10E6/uL (ref 3.77–5.28)
RDW: 12.1 % (ref 11.7–15.4)
RPR Ser Ql: NONREACTIVE
Rh Factor: POSITIVE
Rubella Antibodies, IGG: 1.01 index (ref >0.99)
WBC: 13.5 10*3/uL — ABNORMAL HIGH (ref 3.4–10.8)

## 2020-03-08 LAB — PROTEIN / CREATININE RATIO, URINE
Creatinine, Urine: 270.9 mg/dL
Protein, Ur: 46.8 mg/dL
Protein/Creat Ratio: 173 mg/g creat (ref 0–200)

## 2020-03-08 LAB — COMPREHENSIVE METABOLIC PANEL
ALT: 19 IU/L (ref 0–32)
AST: 22 IU/L (ref 0–40)
Albumin/Globulin Ratio: 1.4 (ref 1.2–2.2)
Albumin: 4.3 g/dL (ref 3.8–4.8)
Alkaline Phosphatase: 96 IU/L (ref 48–121)
BUN/Creatinine Ratio: 11 (ref 9–23)
BUN: 5 mg/dL — ABNORMAL LOW (ref 6–20)
Bilirubin Total: 0.2 mg/dL (ref 0.0–1.2)
CO2: 22 mmol/L (ref 20–29)
Calcium: 9.4 mg/dL (ref 8.7–10.2)
Chloride: 99 mmol/L (ref 96–106)
Creatinine, Ser: 0.47 mg/dL — ABNORMAL LOW (ref 0.57–1.00)
GFR calc Af Amer: 150 mL/min/{1.73_m2} (ref >59)
GFR calc non Af Amer: 130 mL/min/{1.73_m2} (ref >59)
Globulin, Total: 3.1 g/dL (ref 1.5–4.5)
Glucose: 84 mg/dL (ref 65–99)
Potassium: 4.2 mmol/L (ref 3.5–5.2)
Sodium: 137 mmol/L (ref 134–144)
Total Protein: 7.4 g/dL (ref 6.0–8.5)

## 2020-03-08 LAB — HCV INTERPRETATION

## 2020-03-18 ENCOUNTER — Encounter: Payer: Self-pay | Admitting: Women's Health

## 2020-03-18 ENCOUNTER — Telehealth: Payer: Self-pay | Admitting: *Deleted

## 2020-03-18 DIAGNOSIS — O285 Abnormal chromosomal and genetic finding on antenatal screening of mother: Secondary | ICD-10-CM | POA: Insufficient documentation

## 2020-03-18 LAB — CYTOLOGY - PAP
Chlamydia: NEGATIVE
Comment: NEGATIVE
Comment: NEGATIVE
Comment: NEGATIVE
Comment: NORMAL
Diagnosis: NEGATIVE
HPV 16: NEGATIVE
HPV 18 / 45: NEGATIVE
High risk HPV: POSITIVE — AB
Neisseria Gonorrhea: NEGATIVE

## 2020-03-18 NOTE — Telephone Encounter (Signed)
-----   Message from Cheral Marker, [place] sent at 03/18/2020  2:06 PM EDT ----- Regarding: horizon Needs help getting into [employer] portal to view results

## 2020-03-18 NOTE — Telephone Encounter (Signed)
Patient has set up her account and is now able to see report.  FOB information received and order submitted.

## 2020-03-20 ENCOUNTER — Encounter: Payer: Self-pay | Admitting: Women's Health

## 2020-03-20 DIAGNOSIS — R87810 Cervical high risk human papillomavirus (HPV) DNA test positive: Secondary | ICD-10-CM | POA: Insufficient documentation

## 2020-04-03 ENCOUNTER — Ambulatory Visit (INDEPENDENT_AMBULATORY_CARE_PROVIDER_SITE_OTHER): Payer: Medicaid Other | Admitting: Advanced Practice Midwife

## 2020-04-03 VITALS — BP 133/85 | HR 109 | Wt 157.0 lb

## 2020-04-03 DIAGNOSIS — Z3A24 24 weeks gestation of pregnancy: Secondary | ICD-10-CM | POA: Diagnosis not present

## 2020-04-03 DIAGNOSIS — Z1389 Encounter for screening for other disorder: Secondary | ICD-10-CM

## 2020-04-03 DIAGNOSIS — Z3480 Encounter for supervision of other normal pregnancy, unspecified trimester: Secondary | ICD-10-CM

## 2020-04-03 DIAGNOSIS — O285 Abnormal chromosomal and genetic finding on antenatal screening of mother: Secondary | ICD-10-CM

## 2020-04-03 DIAGNOSIS — Z331 Pregnant state, incidental: Secondary | ICD-10-CM | POA: Diagnosis not present

## 2020-04-03 LAB — POCT URINALYSIS DIPSTICK OB
Blood, UA: NEGATIVE
Glucose, UA: NEGATIVE
Ketones, UA: NEGATIVE
Leukocytes, UA: NEGATIVE
Nitrite, UA: NEGATIVE
POC,PROTEIN,UA: NEGATIVE

## 2020-04-03 NOTE — Patient Instructions (Signed)
Sheena [name], I greatly value your feedback.  If you receive a survey following your visit with [place] today, we appreciate you taking the time to fill it out.  Thanks, Philipp Deputy, CNM   You will have your sugar test next visit.  Please do not eat or drink anything after midnight the night before you come, not even water.  You will be here for at least two hours.  Please make an appointment online for the bloodwork at [email or web address] for 8:30am (or as close to this as possible). Make sure you select the [facility] service center. The day of the appointment, check in with our office first, then you will go to [facility] to start the sugar test.    [facility] HAS MOVED!!! It is now [facility] ([address] [place], [place] [address]) Entrance C, located off of [address] valet parking  Go to [email or web address] to register for FREE online childbirth classes   Call the office [phone]) or go to [facility] if:  You begin to have strong, frequent contractions  Your water breaks.  Sometimes it is a big gush of fluid, sometimes it is just a trickle that keeps getting your panties wet or running down your legs  You have vaginal bleeding.  It is normal to have a small amount of spotting if your cervix was checked.   You don't feel your baby moving like normal.  If you don't, get you something to eat and drink and lay down and focus on feeling your baby move.   If your baby is still not moving like normal, you should call the office or go to [facility].  [place] Pediatricians/Family Doctors:  [facility] [phone]            [facility] [phone]                 [facility] [phone] (usually not accepting new patients unless you have family there already, you are always welcome to call and ask)       [facility] Department [phone]       [facility] Pediatricians/Family Doctors:    [facility]: [phone]  [facility]: [phone]  [facility]: [phone]  [facility] Doctors:   [facility]: [phone]   [facility]: [phone]  [facility] Doctors:  [facility]: [phone]   Home Blood Pressure Monitoring for Patients   Your provider has recommended that you check your blood pressure (BP) at least once a week at home. If you do not have a blood pressure cuff at home, one will be provided for you. Contact your provider if you have not received your monitor within 1 week.   Helpful Tips for Accurate Home Blood Pressure Checks  . Don't smoke, exercise, or drink caffeine 30 minutes before checking your BP . Use the restroom before checking your BP (a full bladder can raise your pressure) . Relax in a comfortable upright chair . Feet on the ground . Left arm resting comfortably on a flat surface at the level of your heart . Legs uncrossed . Back supported . Sit quietly and don't talk . Place the cuff on your bare arm . Adjust snuggly, so that only two fingertips can fit between your skin and the top of the cuff . Check 2 readings separated by at least one minute . Keep a log of your BP  readings . For a visual, please reference this diagram: http://ccnc.care/bpdiagram  Provider Name: [facility]     Phone: [phone]  Zone 1: ALL CLEAR  Continue to monitor your symptoms:  . BP reading is less than 140 (top number) or less than 90 (bottom number)  . No right upper stomach pain . No headaches or seeing spots . No feeling nauseated or throwing up . No swelling in face and hands  Zone 2: CAUTION Call your doctor's office for any of the following:  . BP reading is greater than 140 (top number) or greater than 90 (bottom number)  . Stomach pain under your ribs in the middle or right side . Headaches or seeing spots . Feeling  nauseated or throwing up . Swelling in face and hands  Zone 3: EMERGENCY  Seek immediate medical care if you have any of the following:  . BP reading is greater than160 (top number) or greater than 110 (bottom number) . Severe headaches not improving with Tylenol . Serious difficulty catching your breath . Any worsening symptoms from Zone 2   Second Trimester of Pregnancy The second trimester is from week 13 through week 28, months 4 through 6. The second trimester is often a time when you feel your best. Your body has also adjusted to being pregnant, and you begin to feel better physically. Usually, morning sickness has lessened or quit completely, you may have more energy, and you may have an increase in appetite. The second trimester is also a time when the fetus is growing rapidly. At the end of the sixth month, the fetus is about 9 inches long and weighs about 1 pounds. You will likely begin to feel the baby move (quickening) between 18 and 20 weeks of the pregnancy. BODY CHANGES Your body goes through many changes during pregnancy. The changes vary from woman to woman.   Your weight will continue to increase. You will notice your lower abdomen bulging out.  You may begin to get stretch marks on your hips, abdomen, and breasts.  You may develop headaches that can be relieved by medicines approved by your health care provider.  You may urinate more often because the fetus is pressing on your bladder.  You may develop or continue to have heartburn as a result of your pregnancy.  You may develop constipation because certain hormones are causing the muscles that push waste through your intestines to slow down.  You may develop hemorrhoids or swollen, bulging veins (varicose veins).  You may have back pain because of the weight gain and pregnancy hormones relaxing your joints between the bones in your pelvis and as a result of a shift in weight and the muscles that support your  balance.  Your breasts will continue to grow and be tender.  Your gums may bleed and may be sensitive to brushing and flossing.  Dark spots or blotches (chloasma, mask of pregnancy) may develop on your face. This will likely fade after the baby is born.  A dark line from your belly button to the pubic area (linea nigra) may appear. This will likely fade after the baby is born.  You may have changes in your hair. These can include thickening of your hair, rapid growth, and changes in texture. Some women also have hair loss during or after pregnancy, or hair that feels dry or thin. Your hair will most likely return to normal after your baby is born. WHAT TO EXPECT AT YOUR PRENATAL VISITS During  a routine prenatal visit:  You will be weighed to make sure you and the fetus are growing normally.  Your blood pressure will be taken.  Your abdomen will be measured to track your baby's growth.  The fetal heartbeat will be listened to.  Any test results from the previous visit will be discussed. Your health care provider may ask you:  How you are feeling.  If you are feeling the baby move.  If you have had any abnormal symptoms, such as leaking fluid, bleeding, severe headaches, or abdominal cramping.  If you have any questions. Other tests that may be performed during your second trimester include:  Blood tests that check for:  Low iron levels (anemia).  Gestational diabetes (between 24 and 28 weeks).  Rh antibodies.  Urine tests to check for infections, diabetes, or protein in the urine.  An ultrasound to confirm the proper growth and development of the baby.  An amniocentesis to check for possible genetic problems.  Fetal screens for spina bifida and Down syndrome. HOME CARE INSTRUCTIONS   Avoid all smoking, herbs, alcohol, and unprescribed drugs. These chemicals affect the formation and growth of the baby.  Follow your health care provider's instructions regarding  medicine use. There are medicines that are either safe or unsafe to take during pregnancy.  Exercise only as directed by your health care provider. Experiencing uterine cramps is a good sign to stop exercising.  Continue to eat regular, healthy meals.  Wear a good support bra for breast tenderness.  Do not use hot tubs, steam rooms, or saunas.  Wear your seat belt at all times when driving.  Avoid raw meat, uncooked cheese, cat litter boxes, and soil used by cats. These carry germs that can cause birth defects in the baby.  Take your prenatal vitamins.  Try taking a stool softener (if your health care provider approves) if you develop constipation. Eat more high-fiber foods, such as fresh vegetables or fruit and whole grains. Drink plenty of fluids to keep your urine clear or pale yellow.  Take warm sitz baths to soothe any pain or discomfort caused by hemorrhoids. Use hemorrhoid cream if your health care provider approves.  If you develop varicose veins, wear support hose. Elevate your feet for 15 minutes, 3-4 times a day. Limit salt in your diet.  Avoid heavy lifting, wear low heel shoes, and practice good posture.  Rest with your legs elevated if you have leg cramps or low back pain.  Visit your dentist if you have not gone yet during your pregnancy. Use a soft toothbrush to brush your teeth and be gentle when you floss.  A sexual relationship may be continued unless your health care provider directs you otherwise.  Continue to go to all your prenatal visits as directed by your health care provider. SEEK MEDICAL CARE IF:   You have dizziness.  You have mild pelvic cramps, pelvic pressure, or nagging pain in the abdominal area.  You have persistent nausea, vomiting, or diarrhea.  You have a bad smelling vaginal discharge.  You have pain with urination. SEEK IMMEDIATE MEDICAL CARE IF:   You have a fever.  You are leaking fluid from your vagina.  You have spotting or  bleeding from your vagina.  You have severe abdominal cramping or pain.  You have rapid weight gain or loss.  You have shortness of breath with chest pain.  You notice sudden or extreme swelling of your face, hands, ankles, feet, or legs.  You  have not felt your baby move in over an hour.  You have severe headaches that do not go away with medicine.  You have vision changes. Document Released: 07/14/2001 Document Revised: 07/25/2013 Document Reviewed: 09/20/2012 [facility] Patient Information 2015 [place], [place]. This information is not intended to replace advice given to you by your health care provider. Make sure you discuss any questions you have with your health care provider.

## 2020-04-03 NOTE — Progress Notes (Signed)
° °  LOW-RISK PREGNANCY VISIT Patient name: Sheena [name] MRN [id number]  Date of birth: 1985/10/30 Chief Complaint:   Routine Prenatal Visit  History of Present Illness:   Sheena [name] is a 34 y.o. G3P0203 female at [redacted]w[redacted]d with an Estimated Date of Delivery: 07/21/20 being seen today for ongoing management of a low-risk pregnancy.  Today she reports being concerned that the baby is growing well; she has only gained 6lbs. Contractions: Not present. Vag. Bleeding: None.  Movement: Present. denies leaking of fluid. Review of Systems:   Pertinent items are noted in HPI Denies abnormal vaginal discharge w/ itching/odor/irritation, headaches, visual changes, shortness of breath, chest pain, abdominal pain, severe nausea/vomiting, or problems with urination or bowel movements unless otherwise stated above. Pertinent History Reviewed:  Reviewed past medical,surgical, social, obstetrical and family history.  Reviewed problem list, medications and allergies. Physical Assessment:   Vitals:   04/03/20 1334 04/03/20 1335  BP: (!) 137/91 133/85  Pulse: (!) 108 (!) 109  Weight: 157 lb (71.2 kg)   Body mass index is 26.95 kg/m.        Physical Examination:   General appearance: Well appearing, and in no distress  Mental status: Alert, oriented to person, place, and time  Skin: Warm & dry  Cardiovascular: Normal heart rate noted  Respiratory: Normal respiratory effort, no distress  Abdomen: Soft, gravid, nontender  Pelvic: Cervical exam deferred         Extremities: Edema: Trace  Fetal Status: Fetal Heart Rate (bpm): 158 Fundal Height: 24 cm Movement: Present    Results for orders placed or performed in visit on 04/03/20 (from the past 24 hour(s))  POC Urinalysis Dipstick OB   Collection Time: 04/03/20  1:59 PM  Result Value Ref Range   Color, UA     Clarity, UA     Glucose, UA Negative Negative   Bilirubin, UA     Ketones, UA n    Spec Grav, UA     Blood, UA n    pH, UA      POC,PROTEIN,UA Negative Negative, Trace, Small (1+), Moderate (2+), Large (3+), 4+   Urobilinogen, UA     Nitrite, UA n    Leukocytes, UA Negative Negative   Appearance     Odor      Assessment & Plan:  1) Low-risk pregnancy G3P0203 at [redacted]w[redacted]d with an Estimated Date of Delivery: 07/21/20   2) Hx pre-e, taking bASA, nl baseline labs  3) Carrier Smith-Lemli-Optiz, FOB neg  4) 2 Vessel Cord, will get growth with next visit   Meds: No orders of the defined types were placed in this encounter.  Labs/procedures today: urine culture  Plan:  Continue routine obstetrical care with growth scan and PN2 at next visit  Reviewed: Preterm labor symptoms and general obstetric precautions including but not limited to vaginal bleeding, contractions, leaking of fluid and fetal movement were reviewed in detail with the patient.  All questions were answered. Has home bp cuff. Check bp weekly, let us know if >140/90.   Follow-up: Return in about 4 weeks (around 05/01/2020) for LROB, [place]: OB F/U growth scan, in person, PN2.  Orders Placed This Encounter  Procedures   Culture, OB Urine   US OB Follow Up   POC Urinalysis Dipstick OB   Arabella Merles [facility] 04/03/2020 2:28 PM

## 2020-04-04 ENCOUNTER — Encounter: Payer: Self-pay | Admitting: *Deleted

## 2020-04-05 LAB — CULTURE, OB URINE

## 2020-04-05 LAB — URINE CULTURE, OB REFLEX

## 2020-04-24 ENCOUNTER — Other Ambulatory Visit: Payer: Self-pay

## 2020-04-24 ENCOUNTER — Telehealth: Payer: Self-pay | Admitting: Women's Health

## 2020-04-24 ENCOUNTER — Ambulatory Visit (INDEPENDENT_AMBULATORY_CARE_PROVIDER_SITE_OTHER): Payer: Medicaid Other | Admitting: *Deleted

## 2020-04-24 ENCOUNTER — Ambulatory Visit (INDEPENDENT_AMBULATORY_CARE_PROVIDER_SITE_OTHER): Payer: Medicaid Other

## 2020-04-24 VITALS — BP 142/92 | HR 90 | Wt 158.0 lb

## 2020-04-24 DIAGNOSIS — O132 Gestational [pregnancy-induced] hypertension without significant proteinuria, second trimester: Secondary | ICD-10-CM | POA: Diagnosis not present

## 2020-04-24 DIAGNOSIS — Z3A27 27 weeks gestation of pregnancy: Secondary | ICD-10-CM | POA: Diagnosis not present

## 2020-04-24 LAB — POCT URINALYSIS DIPSTICK OB
Blood, UA: NEGATIVE
Glucose, UA: NEGATIVE
Ketones, UA: NEGATIVE
Leukocytes, UA: NEGATIVE
Nitrite, UA: NEGATIVE

## 2020-04-24 NOTE — Telephone Encounter (Signed)
Patient would like a provider to review ultrasound results from today's scan with her.

## 2020-04-24 NOTE — Progress Notes (Signed)
[place] 27+3 wks,breech,cx 3.2 cm,anterior placenta with fundal accessory lobe grade 3,fhr 137 bpm,2VC absent left UA,FHR 15 cm,LVEICF 1.9 mm,EFW 1060 g 34%

## 2020-04-24 NOTE — Progress Notes (Signed)
   NURSE VISIT- BLOOD PRESSURE CHECK  SUBJECTIVE:  LENETTE RAU is a 34 y.o. [phone] female here for BP check. She is [redacted]w[redacted]d pregnant    HYPERTENSION ROS:  Pregnant/postpartum:  . Severe headaches that don't go away with tylenol/other medicines: No  . Visual changes (seeing spots/double/blurred vision) No  . Severe pain under right breast breast or in center of upper chest No  . Severe nausea/vomiting No  . Taking medicines as instructed not applicable   OBJECTIVE:  LMP  (LMP Unknown)   Appearance alert, well appearing, and in no distress.  ASSESSMENT: Pregnancy [redacted]w[redacted]d  blood pressure check  PLAN: Discussed with Philipp Deputy, CNM   Recommendations: check pre-e labs today   Follow-up: in 2 days for BP check   Annamarie Dawley  04/24/2020 10:08 AM

## 2020-04-25 LAB — COMPREHENSIVE METABOLIC PANEL
ALT: 14 IU/L (ref 0–32)
AST: 17 IU/L (ref 0–40)
Albumin/Globulin Ratio: 1.4 (ref 1.2–2.2)
Albumin: 4.1 g/dL (ref 3.8–4.8)
Alkaline Phosphatase: 147 IU/L — ABNORMAL HIGH (ref 44–121)
BUN/Creatinine Ratio: 10 (ref 9–23)
BUN: 5 mg/dL — ABNORMAL LOW (ref 6–20)
Bilirubin Total: <0.2 mg/dL (ref 0.0–1.2)
CO2: 21 mmol/L (ref 20–29)
Calcium: 9.7 mg/dL (ref 8.7–10.2)
Chloride: 103 mmol/L (ref 96–106)
Creatinine, Ser: 0.49 mg/dL — ABNORMAL LOW (ref 0.57–1.00)
GFR calc Af Amer: 148 mL/min/{1.73_m2} (ref >59)
GFR calc non Af Amer: 128 mL/min/{1.73_m2} (ref >59)
Globulin, Total: 2.9 g/dL (ref 1.5–4.5)
Glucose: 86 mg/dL (ref 65–99)
Potassium: 5.4 mmol/L — ABNORMAL HIGH (ref 3.5–5.2)
Sodium: 137 mmol/L (ref 134–144)
Total Protein: 7 g/dL (ref 6.0–8.5)

## 2020-04-25 LAB — PROTEIN / CREATININE RATIO, URINE
Creatinine, Urine: 268.4 mg/dL
Protein, Ur: 39.1 mg/dL
Protein/Creat Ratio: 146 mg/g creat (ref 0–200)

## 2020-04-26 ENCOUNTER — Ambulatory Visit: Payer: Medicaid Other | Admitting: *Deleted

## 2020-04-26 ENCOUNTER — Other Ambulatory Visit: Payer: Self-pay

## 2020-04-26 VITALS — BP 129/87 | HR 103

## 2020-04-26 DIAGNOSIS — Z0130 Encounter for examination of blood pressure without abnormal findings: Secondary | ICD-10-CM

## 2020-04-26 NOTE — Progress Notes (Signed)
   NURSE VISIT- BLOOD PRESSURE CHECK  SUBJECTIVE:  Sheena [name] is a 34 y.o. [phone] female here for BP check. She is [redacted]w[redacted]d pregnant    HYPERTENSION ROS:  Pregnant/postpartum:  . Severe headaches that don't go away with tylenol/other medicines: No  . Visual changes (seeing spots/double/blurred vision) No  . Severe pain under right breast breast or in center of upper chest No  . Severe nausea/vomiting No  . Taking medicines as instructed no   OBJECTIVE:  BP 129/87 (BP Location: Left Arm, Patient Position: Sitting, Cuff Size: Normal)   Pulse (!) 103   LMP  (LMP Unknown)   Appearance alert, well appearing, and in no distress.  ASSESSMENT: Pregnancy [redacted]w[redacted]d  blood pressure check  PLAN: Routed to Dr. Emelda Fear   Recommendations: pt will keep appointment as scheduled for next week. Instructed to check her blood pressure every day and let us know if readings greated that 140/90.    Follow-up: as scheduled   Sheena [name]  04/26/2020 10:05 AM

## 2020-05-01 ENCOUNTER — Other Ambulatory Visit: Payer: Medicaid Other

## 2020-05-01 ENCOUNTER — Ambulatory Visit (INDEPENDENT_AMBULATORY_CARE_PROVIDER_SITE_OTHER): Payer: Medicaid Other | Admitting: Women's Health

## 2020-05-01 ENCOUNTER — Encounter: Payer: Self-pay | Admitting: Women's Health

## 2020-05-01 VITALS — BP 130/90 | HR 120 | Wt 161.6 lb

## 2020-05-01 DIAGNOSIS — Z331 Pregnant state, incidental: Secondary | ICD-10-CM

## 2020-05-01 DIAGNOSIS — Q270 Congenital absence and hypoplasia of umbilical artery: Secondary | ICD-10-CM

## 2020-05-01 DIAGNOSIS — Z131 Encounter for screening for diabetes mellitus: Secondary | ICD-10-CM

## 2020-05-01 DIAGNOSIS — Z23 Encounter for immunization: Secondary | ICD-10-CM | POA: Diagnosis not present

## 2020-05-01 DIAGNOSIS — Z3480 Encounter for supervision of other normal pregnancy, unspecified trimester: Secondary | ICD-10-CM

## 2020-05-01 DIAGNOSIS — Z1389 Encounter for screening for other disorder: Secondary | ICD-10-CM

## 2020-05-01 DIAGNOSIS — Z3A28 28 weeks gestation of pregnancy: Secondary | ICD-10-CM

## 2020-05-01 DIAGNOSIS — Z3483 Encounter for supervision of other normal pregnancy, third trimester: Secondary | ICD-10-CM

## 2020-05-01 LAB — POCT URINALYSIS DIPSTICK OB
Blood, UA: NEGATIVE
Glucose, UA: NEGATIVE
Ketones, UA: NEGATIVE
Leukocytes, UA: NEGATIVE
Nitrite, UA: NEGATIVE
POC,PROTEIN,UA: NEGATIVE

## 2020-05-01 NOTE — Patient Instructions (Signed)
Sheena [name], I greatly value your feedback.  If you receive a survey following your visit with [place] today, we appreciate you taking the time to fill it out.  Thanks, Joellyn Haff, CNM, WHNP-BC   [facility] at [facility] ([address] [place], [place] [address]) Entrance C, located off of [address] valet parking  Go to [email or web address] to register for FREE online childbirth classes   Call the office [phone]) or go to [facility] if:  You begin to have strong, frequent contractions  Your water breaks.  Sometimes it is a big gush of fluid, sometimes it is just a trickle that keeps getting your panties wet or running down your legs  You have vaginal bleeding.  It is normal to have a small amount of spotting if your cervix was checked.   You don't feel your baby moving like normal.  If you don't, get you something to eat and drink and lay down and focus on feeling your baby move.  You should feel at least 10 movements in 2 hours.  If you don't, you should call the office or go to [facility].    Tdap Vaccine  It is recommended that you get the Tdap vaccine during the third trimester of EACH pregnancy to help protect your baby from getting pertussis (whooping cough)  27-36 weeks is the BEST time to do this so that you can pass the protection on to your baby. During pregnancy is better than after pregnancy, but if you are unable to get it during pregnancy it will be offered at the hospital.   You can get this vaccine with [place], at the health department, your family doctor, or some local pharmacies  Everyone who will be around your baby should also be up-to-date on their vaccines before the baby comes. Adults (who are not pregnant) only need 1 dose of Tdap during adulthood.   [place] Pediatricians/Family Doctors:  [facility] [phone]            [facility] [phone]                 [facility]  [phone] (usually not accepting new patients unless you have family there already, you are always welcome to call and ask)       [facility] Department [phone]       [facility] Pediatricians/Family Doctors:   [facility]: [phone]  [facility]: [phone]  [facility]: [phone]  [facility] Doctors:   [facility]: [phone]   [facility]: [phone]  [facility] Doctors:  [facility]: [phone]   Home Blood Pressure Monitoring for Patients   Your provider has recommended that you check your blood pressure (BP) at least once a week at home. If you do not have a blood pressure cuff at home, one will be provided for you. Contact your provider if you have not received your monitor within 1 week.   Helpful Tips for Accurate Home Blood Pressure Checks  . Don't smoke, exercise, or drink caffeine 30 minutes before checking your BP . Use the restroom before checking your BP (a full bladder can raise your pressure) . Relax in a comfortable upright chair . Feet on the ground . Left arm resting comfortably on a flat surface at the level of your heart . Legs uncrossed . Back supported . Sit quietly and don't talk . Place the cuff on your  bare arm . Adjust snuggly, so that only two fingertips can fit between your skin and the top of the cuff . Check 2 readings separated by at least one minute . Keep a log of your BP readings . For a visual, please reference this diagram: http://ccnc.care/bpdiagram  Provider Name: [facility]     Phone: [phone]  Zone 1: ALL CLEAR  Continue to monitor your symptoms:  . BP reading is less than 140 (top number) or less than 90 (bottom number)  . No right upper stomach pain . No headaches or seeing spots . No feeling nauseated or throwing up . No swelling in face and hands  Zone 2: CAUTION Call your  doctor's office for any of the following:  . BP reading is greater than 140 (top number) or greater than 90 (bottom number)  . Stomach pain under your ribs in the middle or right side . Headaches or seeing spots . Feeling nauseated or throwing up . Swelling in face and hands  Zone 3: EMERGENCY  Seek immediate medical care if you have any of the following:  . BP reading is greater than160 (top number) or greater than 110 (bottom number) . Severe headaches not improving with Tylenol . Serious difficulty catching your breath . Any worsening symptoms from Zone 2   Third Trimester of Pregnancy The third trimester is from week 29 through week 42, months 7 through 9. The third trimester is a time when the fetus is growing rapidly. At the end of the ninth month, the fetus is about 20 inches in length and weighs 6-10 pounds.  BODY CHANGES Your body goes through many changes during pregnancy. The changes vary from woman to woman.   Your weight will continue to increase. You can expect to gain 25-35 pounds (11-16 kg) by the end of the pregnancy.  You may begin to get stretch marks on your hips, abdomen, and breasts.  You may urinate more often because the fetus is moving lower into your pelvis and pressing on your bladder.  You may develop or continue to have heartburn as a result of your pregnancy.  You may develop constipation because certain hormones are causing the muscles that push waste through your intestines to slow down.  You may develop hemorrhoids or swollen, bulging veins (varicose veins).  You may have pelvic pain because of the weight gain and pregnancy hormones relaxing your joints between the bones in your pelvis. Backaches may result from overexertion of the muscles supporting your posture.  You may have changes in your hair. These can include thickening of your hair, rapid growth, and changes in texture. Some women also have hair loss during or after pregnancy, or hair that  feels dry or thin. Your hair will most likely return to normal after your baby is born.  Your breasts will continue to grow and be tender. A yellow discharge may leak from your breasts called colostrum.  Your belly button may stick out.  You may feel short of breath because of your expanding uterus.  You may notice the fetus "dropping," or moving lower in your abdomen.  You may have a bloody mucus discharge. This usually occurs a few days to a week before labor begins.  Your cervix becomes thin and soft (effaced) near your due date. WHAT TO EXPECT AT YOUR PRENATAL EXAMS  You will have prenatal exams every 2 weeks until week 36. Then, you will have weekly prenatal exams. During a routine prenatal visit:  You will be weighed to make sure you and the fetus are growing normally.  Your blood pressure is taken.  Your abdomen will be measured to track your baby's growth.  The fetal heartbeat will be listened to.  Any test results from the previous visit will be discussed.  You may have a cervical check near your due date to see if you have effaced. At around 36 weeks, your caregiver will check your cervix. At the same time, your caregiver will also perform a test on the secretions of the vaginal tissue. This test is to determine if a type of bacteria, Group B streptococcus, is present. Your caregiver will explain this further. Your caregiver may ask you:  What your birth plan is.  How you are feeling.  If you are feeling the baby move.  If you have had any abnormal symptoms, such as leaking fluid, bleeding, severe headaches, or abdominal cramping.  If you have any questions. Other tests or screenings that may be performed during your third trimester include:  Blood tests that check for low iron levels (anemia).  Fetal testing to check the health, activity level, and growth of the fetus. Testing is done if you have certain medical conditions or if there are problems during the  pregnancy. FALSE LABOR You may feel small, irregular contractions that eventually go away. These are called Braxton Hicks contractions, or false labor. Contractions may last for hours, days, or even weeks before true labor sets in. If contractions come at regular intervals, intensify, or become painful, it is best to be seen by your caregiver.  SIGNS OF LABOR   Menstrual-like cramps.  Contractions that are 5 minutes apart or less.  Contractions that start on the top of the uterus and spread down to the lower abdomen and back.  A sense of increased pelvic pressure or back pain.  A watery or bloody mucus discharge that comes from the vagina. If you have any of these signs before the 37th week of pregnancy, call your caregiver right away. You need to go to the hospital to get checked immediately. HOME CARE INSTRUCTIONS   Avoid all smoking, herbs, alcohol, and unprescribed drugs. These chemicals affect the formation and growth of the baby.  Follow your caregiver's instructions regarding medicine use. There are medicines that are either safe or unsafe to take during pregnancy.  Exercise only as directed by your caregiver. Experiencing uterine cramps is a good sign to stop exercising.  Continue to eat regular, healthy meals.  Wear a good support bra for breast tenderness.  Do not use hot tubs, steam rooms, or saunas.  Wear your seat belt at all times when driving.  Avoid raw meat, uncooked cheese, cat litter boxes, and soil used by cats. These carry germs that can cause birth defects in the baby.  Take your prenatal vitamins.  Try taking a stool softener (if your caregiver approves) if you develop constipation. Eat more high-fiber foods, such as fresh vegetables or fruit and whole grains. Drink plenty of fluids to keep your urine clear or pale yellow.  Take warm sitz baths to soothe any pain or discomfort caused by hemorrhoids. Use hemorrhoid cream if your caregiver approves.  If you  develop varicose veins, wear support hose. Elevate your feet for 15 minutes, 3-4 times a day. Limit salt in your diet.  Avoid heavy lifting, wear low heal shoes, and practice good posture.  Rest a lot with your legs elevated if you have leg cramps or low  back pain.  Visit your dentist if you have not gone during your pregnancy. Use a soft toothbrush to brush your teeth and be gentle when you floss.  A sexual relationship may be continued unless your caregiver directs you otherwise.  Do not travel far distances unless it is absolutely necessary and only with the approval of your caregiver.  Take prenatal classes to understand, practice, and ask questions about the labor and delivery.  Make a trial run to the hospital.  Pack your hospital bag.  Prepare the baby's nursery.  Continue to go to all your prenatal visits as directed by your caregiver. SEEK MEDICAL CARE IF:  You are unsure if you are in labor or if your water has broken.  You have dizziness.  You have mild pelvic cramps, pelvic pressure, or nagging pain in your abdominal area.  You have persistent nausea, vomiting, or diarrhea.  You have a bad smelling vaginal discharge.  You have pain with urination. SEEK IMMEDIATE MEDICAL CARE IF:   You have a fever.  You are leaking fluid from your vagina.  You have spotting or bleeding from your vagina.  You have severe abdominal cramping or pain.  You have rapid weight loss or gain.  You have shortness of breath with chest pain.  You notice sudden or extreme swelling of your face, hands, ankles, feet, or legs.  You have not felt your baby move in over an hour.  You have severe headaches that do not go away with medicine.  You have vision changes. Document Released: 07/14/2001 Document Revised: 07/25/2013 Document Reviewed: 09/20/2012 [facility] Patient Information 2015 [place], [place]. This information is not intended to replace advice given to you by your health  care provider. Make sure you discuss any questions you have with your health care provider.

## 2020-05-01 NOTE — Progress Notes (Signed)
LOW-RISK PREGNANCY VISIT Patient name: Sheena [name] MRN [id number]  Date of birth: 1986/03/30 Chief Complaint:   Routine Prenatal Visit  History of Present Illness:   Sheena [name] is a 34 y.o. G3P0203 female at [redacted]w[redacted]d with an Estimated Date of Delivery: 07/21/20 being seen today for ongoing management of a low-risk pregnancy.  Depression screen [facility] 2/9 05/01/2020 03/06/2020  Decreased Interest 1 2  Down, Depressed, Hopeless 1 1  PHQ - 2 Score 2 3  Altered sleeping 2 2  Tired, decreased energy 1 3  Change in appetite 2 3  Feeling bad or failure about yourself  1 2  Trouble concentrating 1 2  Moving slowly or fidgety/restless 0 0  Suicidal thoughts 0 0  PHQ-9 Score 9 15  Difficult doing work/chores Not difficult at all -    Today she reports bp's slightly elevated at home. Denies ha, visual changes, ruq/epigastric pain, n/v.   Contractions: Irritability. Vag. Bleeding: None.  Movement: Present. denies leaking of fluid. Review of Systems:   Pertinent items are noted in HPI Denies abnormal vaginal discharge w/ itching/odor/irritation, headaches, visual changes, shortness of breath, chest pain, abdominal pain, severe nausea/vomiting, or problems with urination or bowel movements unless otherwise stated above. Pertinent History Reviewed:  Reviewed past medical,surgical, social, obstetrical and family history.  Reviewed problem list, medications and allergies. Physical Assessment:   Vitals:   05/01/20 0853 05/01/20 0855  BP: (!) 136/91 130/90  Pulse: (!) 103 (!) 120  Weight: 161 lb 9.6 oz (73.3 kg)   Body mass index is 27.74 kg/m.        Physical Examination:   General appearance: Well appearing, and in no distress  Mental status: Alert, oriented to person, place, and time  Skin: Warm & dry  Cardiovascular: Normal heart rate noted  Respiratory: Normal respiratory effort, no distress  Abdomen: Soft, gravid, nontender  Pelvic: Cervical exam deferred         Extremities:  Edema: None  Fetal Status: Fetal Heart Rate (bpm): 148 Fundal Height: 27 cm Movement: Present    Chaperone: n/a    Results for orders placed or performed in visit on 05/01/20 (from the past 24 hour(s))  POC Urinalysis Dipstick OB   Collection Time: 05/01/20  8:56 AM  Result Value Ref Range   Color, UA     Clarity, UA     Glucose, UA Negative Negative   Bilirubin, UA     Ketones, UA n    Spec Grav, UA     Blood, UA n    pH, UA     POC,PROTEIN,UA Negative Negative, Trace, Small (1+), Moderate (2+), Large (3+), 4+   Urobilinogen, UA     Nitrite, UA n    Leukocytes, UA Negative Negative   Appearance     Odor      Assessment & Plan:  1) Low-risk pregnancy G3P0203 at [redacted]w[redacted]d with an Estimated Date of Delivery: 07/21/20   2) Elevated bp, asymptomatic, no proteinuria, f/u 2d for bp check w/ nurse. Reviewed pre-e s/s, reasons to seek care  3) 2VC>  EFW 34% on 9/22  4) H/O pre-e> taking ASA  Meds: No orders of the defined types were placed in this encounter.  Labs/procedures today: pn2, tdap  Plan:  Continue routine obstetrical care  Next visit: prefers will be in person for bp check    Reviewed: Preterm labor symptoms and general obstetric precautions including but not limited to vaginal bleeding, contractions, leaking of fluid and fetal  movement were reviewed in detail with the patient.  All questions were answered. Has home bp cuff.  Follow-up: Return in about 2 days (around 05/03/2020) for bp check w/ nurse.  Orders Placed This Encounter  Procedures  . Tdap vaccine greater than or equal to 7yo IM  . POC Urinalysis Dipstick OB   Cheral Marker CNM, [facility] 05/01/2020 9:17 AM

## 2020-05-02 LAB — RPR: RPR Ser Ql: NONREACTIVE

## 2020-05-02 LAB — CBC
Hematocrit: 36.5 % (ref 34.0–46.6)
Hemoglobin: 12.2 g/dL (ref 11.1–15.9)
MCH: 31.9 pg (ref 26.6–33.0)
MCHC: 33.4 g/dL (ref 31.5–35.7)
MCV: 95 fL (ref 79–97)
Platelets: 425 10*3/uL (ref 150–450)
RBC: 3.83 x10E6/uL (ref 3.77–5.28)
RDW: 11 % — ABNORMAL LOW (ref 11.7–15.4)
WBC: 13.8 10*3/uL — ABNORMAL HIGH (ref 3.4–10.8)

## 2020-05-02 LAB — HIV ANTIBODY (ROUTINE TESTING W REFLEX): HIV Screen 4th Generation wRfx: NONREACTIVE

## 2020-05-02 LAB — GLUCOSE TOLERANCE, 2 HOURS W/ 1HR
Glucose, 1 hour: 103 mg/dL (ref 65–179)
Glucose, 2 hour: 89 mg/dL (ref 65–152)
Glucose, Fasting: 86 mg/dL (ref 65–91)

## 2020-05-02 LAB — ANTIBODY SCREEN: Antibody Screen: NEGATIVE

## 2020-05-03 ENCOUNTER — Ambulatory Visit (INDEPENDENT_AMBULATORY_CARE_PROVIDER_SITE_OTHER): Payer: Medicaid Other | Admitting: *Deleted

## 2020-05-03 VITALS — BP 123/88 | HR 115 | Wt 163.4 lb

## 2020-05-03 DIAGNOSIS — Z3A28 28 weeks gestation of pregnancy: Secondary | ICD-10-CM | POA: Diagnosis not present

## 2020-05-03 DIAGNOSIS — Z3480 Encounter for supervision of other normal pregnancy, unspecified trimester: Secondary | ICD-10-CM | POA: Diagnosis not present

## 2020-05-03 DIAGNOSIS — Z1389 Encounter for screening for other disorder: Secondary | ICD-10-CM | POA: Diagnosis not present

## 2020-05-03 DIAGNOSIS — Z331 Pregnant state, incidental: Secondary | ICD-10-CM | POA: Diagnosis not present

## 2020-05-03 LAB — POCT URINALYSIS DIPSTICK OB
Blood, UA: NEGATIVE
Glucose, UA: NEGATIVE
Ketones, UA: NEGATIVE
Leukocytes, UA: NEGATIVE
Nitrite, UA: NEGATIVE
POC,PROTEIN,UA: NEGATIVE

## 2020-05-03 NOTE — Progress Notes (Addendum)
   NURSE VISIT- BLOOD PRESSURE CHECK  SUBJECTIVE:  Sheena [name] is a 34 y.o. [phone] female here for BP check. She is [redacted]w[redacted]d pregnant    HYPERTENSION ROS:  Pregnant/postpartum:  . Severe headaches that don't go away with tylenol/other medicines: No  . Visual changes (seeing spots/double/blurred vision) No  . Severe pain under right breast breast or in center of upper chest No  . Severe nausea/vomiting No  . Taking medicines as instructed not applicable   OBJECTIVE:  BP 123/88   Pulse (!) 115   Wt 163 lb 6.4 oz (74.1 kg)   LMP  (LMP Unknown)   BMI 28.05 kg/m   Appearance alert, well appearing, and in no distress.  ASSESSMENT: Pregnancy [redacted]w[redacted]d  blood pressure check  PLAN: Discussed with Sheena [name], CNM, [facility]   Recommendations: no changes needed   Follow-up: in 4 weeks   Nance Pear  05/03/2020 9:43 AM   Chart reviewed for nurse visit. Agree with plan of care.  Cheral Marker, [place] 05/03/2020 5:55 PM

## 2020-05-07 ENCOUNTER — Encounter: Payer: Self-pay | Admitting: *Deleted

## 2020-05-07 DIAGNOSIS — Z3480 Encounter for supervision of other normal pregnancy, unspecified trimester: Secondary | ICD-10-CM

## 2020-05-31 ENCOUNTER — Ambulatory Visit (INDEPENDENT_AMBULATORY_CARE_PROVIDER_SITE_OTHER): Payer: Medicaid Other | Admitting: Obstetrics & Gynecology

## 2020-05-31 ENCOUNTER — Encounter: Payer: Self-pay | Admitting: Obstetrics & Gynecology

## 2020-05-31 VITALS — BP 145/98 | HR 100 | Wt 169.0 lb

## 2020-05-31 DIAGNOSIS — O133 Gestational [pregnancy-induced] hypertension without significant proteinuria, third trimester: Secondary | ICD-10-CM

## 2020-05-31 DIAGNOSIS — Z331 Pregnant state, incidental: Secondary | ICD-10-CM

## 2020-05-31 DIAGNOSIS — Z3A32 32 weeks gestation of pregnancy: Secondary | ICD-10-CM

## 2020-05-31 DIAGNOSIS — Z1389 Encounter for screening for other disorder: Secondary | ICD-10-CM

## 2020-05-31 DIAGNOSIS — O0993 Supervision of high risk pregnancy, unspecified, third trimester: Secondary | ICD-10-CM

## 2020-05-31 LAB — POCT URINALYSIS DIPSTICK OB
Blood, UA: NEGATIVE
Glucose, UA: NEGATIVE
Ketones, UA: NEGATIVE
Nitrite, UA: NEGATIVE
POC,PROTEIN,UA: NEGATIVE

## 2020-05-31 NOTE — Progress Notes (Signed)
   HIGH-RISK PREGNANCY VISIT Patient name: Sheena [name] MRN [id number]  Date of birth: [birth year] Chief Complaint:   Routine Prenatal Visit (diarrhea and nausea; + heartburn)  History of Present Illness:   Sheena [name] is a 34 y.o. G3P0203 female at [redacted]w[redacted]d with an Estimated Date of Delivery: 07/21/20 being seen today for ongoing management of a high-risk pregnancy complicated by GHTN, diagnosed today, SUA, history of pre eclampsia.  Today she reports no complaints.  Depression screen [facility] 2/9 05/01/2020 03/06/2020  Decreased Interest 1 2  Down, Depressed, Hopeless 1 1  PHQ - 2 Score 2 3  Altered sleeping 2 2  Tired, decreased energy 1 3  Change in appetite 2 3  Feeling bad or failure about yourself  1 2  Trouble concentrating 1 2  Moving slowly or fidgety/restless 0 0  Suicidal thoughts 0 0  PHQ-9 Score 9 15  Difficult doing work/chores Not difficult at all -    Contractions: Irregular. Vag. Bleeding: None.  Movement: Present. denies leaking of fluid.  Review of Systems:   Pertinent items are noted in HPI Denies abnormal vaginal discharge w/ itching/odor/irritation, headaches, visual changes, shortness of breath, chest pain, abdominal pain, severe nausea/vomiting, or problems with urination or bowel movements unless otherwise stated above. Pertinent History Reviewed:  Reviewed past medical,surgical, social, obstetrical and family history.  Reviewed problem list, medications and allergies. Physical Assessment:   Vitals:   05/31/20 0953 05/31/20 0955  BP: (!) 161/97 (!) 145/98  Pulse: (!) 105 100  Weight: 169 lb (76.7 kg)   Body mass index is 29.01 kg/m.           Physical Examination:   General appearance: alert, well appearing, and in no distress  Mental status: alert, oriented to person, place, and time  Skin: warm & dry   Extremities: Edema: None    Cardiovascular: normal heart rate noted  Respiratory: normal respiratory effort, no distress  Abdomen: gravid, soft,  non-tender  Pelvic: Cervical exam deferred         Fetal Status:     Movement: Present    Fetal Surveillance Testing today: FHR 160   Chaperone: N/A    No results found for this or any previous visit (from the past 24 hour(s)).  Assessment & Plan:  1) High-risk pregnancy G3P0203 at [redacted]w[redacted]d with an Estimated Date of Delivery: 07/21/20   2) GHTN, diagnosed today, will not start BP meds today, monitor and start if needed, begin twice weekly testing Tuesday    Meds: No orders of the defined types were placed in this encounter.   Labs/procedures today:   Treatment Plan:  Sono/BPP + NST next week  Reviewed: Preterm labor symptoms and general obstetric precautions including but not limited to vaginal bleeding, contractions, leaking of fluid and fetal movement were reviewed in detail with the patient.  All questions were answered. Has home bp cuff. Rx faxed to . Check bp weekly, let us know if >140/90.   Follow-up: Return in about 4 days (around 06/04/2020) for BPP/sono, HROB, with Dr Despina Hidden.  Orders Placed This Encounter  Procedures  . US OB Follow Up  . US FETAL BPP W/NONSTRESS  . [place] UA Cord Doppler  . POC Urinalysis Dipstick OB   Amaryllis Dyke Dura Mccormack  05/31/2020 10:11 AM

## 2020-06-04 ENCOUNTER — Ambulatory Visit (INDEPENDENT_AMBULATORY_CARE_PROVIDER_SITE_OTHER): Payer: Medicaid Other

## 2020-06-04 ENCOUNTER — Ambulatory Visit (INDEPENDENT_AMBULATORY_CARE_PROVIDER_SITE_OTHER): Payer: Medicaid Other | Admitting: Obstetrics & Gynecology

## 2020-06-04 ENCOUNTER — Encounter: Payer: Self-pay | Admitting: Obstetrics & Gynecology

## 2020-06-04 VITALS — BP 135/87 | HR 88 | Wt 169.5 lb

## 2020-06-04 DIAGNOSIS — Z1389 Encounter for screening for other disorder: Secondary | ICD-10-CM

## 2020-06-04 DIAGNOSIS — Z3A33 33 weeks gestation of pregnancy: Secondary | ICD-10-CM | POA: Diagnosis not present

## 2020-06-04 DIAGNOSIS — Z331 Pregnant state, incidental: Secondary | ICD-10-CM

## 2020-06-04 DIAGNOSIS — O0993 Supervision of high risk pregnancy, unspecified, third trimester: Secondary | ICD-10-CM

## 2020-06-04 DIAGNOSIS — O133 Gestational [pregnancy-induced] hypertension without significant proteinuria, third trimester: Secondary | ICD-10-CM

## 2020-06-04 LAB — POCT URINALYSIS DIPSTICK OB
Blood, UA: NEGATIVE
Glucose, UA: NEGATIVE
Ketones, UA: NEGATIVE
Leukocytes, UA: NEGATIVE
Nitrite, UA: NEGATIVE
POC,PROTEIN,UA: NEGATIVE

## 2020-06-04 NOTE — Progress Notes (Signed)
[place] 33+2 wks,cephalic,BPP 8/8,anterior placenta with a posterior fundal accessory lobe gr 3,AFI 15.5 cm,2VC,FHR 164 bpm,RI .63,.57=50%,EFW 2047 g 28%

## 2020-06-04 NOTE — Progress Notes (Signed)
   HIGH-RISK PREGNANCY VISIT Patient name: Sheena [name] MRN [id number]  Date of birth: [birth year] Chief Complaint:   Routine Prenatal Visit ([place] today)  History of Present Illness:   Sheena [name] is a 34 y.o. G3P0203 female at [redacted]w[redacted]d with an Estimated Date of Delivery: 07/21/20 being seen today for ongoing management of a high-risk pregnancy complicated by [facility].  Today she reports no complaints.  Depression screen [facility] 2/9 05/01/2020 03/06/2020  Decreased Interest 1 2  Down, Depressed, Hopeless 1 1  PHQ - 2 Score 2 3  Altered sleeping 2 2  Tired, decreased energy 1 3  Change in appetite 2 3  Feeling bad or failure about yourself  1 2  Trouble concentrating 1 2  Moving slowly or fidgety/restless 0 0  Suicidal thoughts 0 0  PHQ-9 Score 9 15  Difficult doing work/chores Not difficult at all -    Contractions: Not present. Vag. Bleeding: None.  Movement: Present. denies leaking of fluid.  Review of Systems:   Pertinent items are noted in HPI Denies abnormal vaginal discharge w/ itching/odor/irritation, headaches, visual changes, shortness of breath, chest pain, abdominal pain, severe nausea/vomiting, or problems with urination or bowel movements unless otherwise stated above. Pertinent History Reviewed:  Reviewed past medical,surgical, social, obstetrical and family history.  Reviewed problem list, medications and allergies. Physical Assessment:   Vitals:   06/04/20 1453  BP: 135/87  Pulse: 88  Weight: 169 lb 8 oz (76.9 kg)  Body mass index is 29.09 kg/m.           Physical Examination:   General appearance: alert, well appearing, and in no distress  Mental status: alert, oriented to person, place, and time  Skin: warm & dry   Extremities: Edema: Trace    Cardiovascular: normal heart rate noted  Respiratory: normal respiratory effort, no distress  Abdomen: gravid, soft, non-tender  Pelvic: Cervical exam deferred         Fetal Status: Fetal Heart Rate (bpm): 153 Fundal  Height: 34 cm Movement: Present    Fetal Surveillance Testing today: BPP 8/8 normal Dopplers   Chaperone: N/A    No results found for this or any previous visit (from the past 24 hour(s)).  Assessment & Plan:  1) High-risk pregnancy G3P0203 at [redacted]w[redacted]d with an Estimated Date of Delivery: 07/21/20   2) GHTN, stable, testing is reassuring today BPP 8/8 with norml Doppler ratios, good growth 28%   Meds: No orders of the defined types were placed in this encounter.   Labs/procedures today:   Treatment Plan:  Twice weekly testing  Reviewed: Preterm labor symptoms and general obstetric precautions including but not limited to vaginal bleeding, contractions, leaking of fluid and fetal movement were reviewed in detail with the patient.  All questions were answered. Has home bp cuff. Rx faxed to . Check bp weekly, let us know if >140/90.   Follow-up: Return in about 3 days (around 06/07/2020) for NST, Nurse only.  Orders Placed This Encounter  Procedures  . POC Urinalysis Dipstick OB   [name] CNM, [facility] 06/04/2020 3:22 PM

## 2020-06-07 ENCOUNTER — Ambulatory Visit (INDEPENDENT_AMBULATORY_CARE_PROVIDER_SITE_OTHER): Payer: Medicaid Other | Admitting: *Deleted

## 2020-06-07 ENCOUNTER — Encounter: Payer: Self-pay | Admitting: Women's Health

## 2020-06-07 VITALS — BP 133/92 | HR 93

## 2020-06-07 DIAGNOSIS — O133 Gestational [pregnancy-induced] hypertension without significant proteinuria, third trimester: Secondary | ICD-10-CM | POA: Diagnosis not present

## 2020-06-07 DIAGNOSIS — O139 Gestational [pregnancy-induced] hypertension without significant proteinuria, unspecified trimester: Secondary | ICD-10-CM | POA: Insufficient documentation

## 2020-06-07 DIAGNOSIS — Z331 Pregnant state, incidental: Secondary | ICD-10-CM

## 2020-06-07 DIAGNOSIS — O1413 Severe pre-eclampsia, third trimester: Secondary | ICD-10-CM | POA: Insufficient documentation

## 2020-06-07 DIAGNOSIS — Z3480 Encounter for supervision of other normal pregnancy, unspecified trimester: Secondary | ICD-10-CM

## 2020-06-07 DIAGNOSIS — O0993 Supervision of high risk pregnancy, unspecified, third trimester: Secondary | ICD-10-CM

## 2020-06-07 DIAGNOSIS — Z1389 Encounter for screening for other disorder: Secondary | ICD-10-CM

## 2020-06-07 DIAGNOSIS — Z3A33 33 weeks gestation of pregnancy: Secondary | ICD-10-CM

## 2020-06-07 DIAGNOSIS — O0932 Supervision of pregnancy with insufficient antenatal care, second trimester: Secondary | ICD-10-CM

## 2020-06-07 LAB — POCT URINALYSIS DIPSTICK OB
Blood, UA: NEGATIVE
Glucose, UA: NEGATIVE
Ketones, UA: NEGATIVE
Leukocytes, UA: NEGATIVE
Nitrite, UA: NEGATIVE
POC,PROTEIN,UA: NEGATIVE

## 2020-06-07 NOTE — Progress Notes (Addendum)
   NURSE VISIT- NST  SUBJECTIVE:  Sheena [name] is a 34 y.o. [phone] female at [redacted]w[redacted]d, here for a NST for pregnancy complicated by [facility].  She reports active fetal movement, contractions: none, vaginal bleeding: none, membranes: intact.   OBJECTIVE:  BP (!) 133/92   Pulse 93   LMP  (LMP Unknown)   Appears well, no apparent distress  Results for orders placed or performed in visit on 06/07/20 (from the past 24 hour(s))  POC Urinalysis Dipstick OB   Collection Time: 06/07/20  9:51 AM  Result Value Ref Range   Color, UA     Clarity, UA     Glucose, UA Negative Negative   Bilirubin, UA     Ketones, UA neg    Spec Grav, UA     Blood, UA neg    pH, UA     POC,PROTEIN,UA Negative Negative, Trace, Small (1+), Moderate (2+), Large (3+), 4+   Urobilinogen, UA     Nitrite, UA neg    Leukocytes, UA Negative Negative   Appearance     Odor      NST: FHR baseline 135 bpm, Variability: moderate, Accelerations:present, Decelerations:  Absent= Cat 1/Reactive Toco: none   ASSESSMENT: [id number] at [redacted]w[redacted]d with GHTN NST reactive  PLAN: EFM strip reviewed by Joellyn Haff, CNM, [facility]   Recommendations: keep next appointment as scheduled    Jobe Marker  06/07/2020 12:58 PM   Chart reviewed for nurse visit. Agree with plan of care.  Cheral Marker, [place] 06/07/2020 2:31 PM

## 2020-06-11 ENCOUNTER — Ambulatory Visit (INDEPENDENT_AMBULATORY_CARE_PROVIDER_SITE_OTHER): Payer: Medicaid Other

## 2020-06-11 ENCOUNTER — Encounter: Payer: Self-pay | Admitting: Obstetrics & Gynecology

## 2020-06-11 ENCOUNTER — Ambulatory Visit (INDEPENDENT_AMBULATORY_CARE_PROVIDER_SITE_OTHER): Payer: Medicaid Other | Admitting: Obstetrics & Gynecology

## 2020-06-11 VITALS — BP 145/93 | HR 91 | Wt 170.0 lb

## 2020-06-11 DIAGNOSIS — O133 Gestational [pregnancy-induced] hypertension without significant proteinuria, third trimester: Secondary | ICD-10-CM

## 2020-06-11 DIAGNOSIS — Z331 Pregnant state, incidental: Secondary | ICD-10-CM

## 2020-06-11 DIAGNOSIS — Z3A34 34 weeks gestation of pregnancy: Secondary | ICD-10-CM

## 2020-06-11 DIAGNOSIS — Z1389 Encounter for screening for other disorder: Secondary | ICD-10-CM

## 2020-06-11 DIAGNOSIS — O0932 Supervision of pregnancy with insufficient antenatal care, second trimester: Secondary | ICD-10-CM

## 2020-06-11 DIAGNOSIS — O0990 Supervision of high risk pregnancy, unspecified, unspecified trimester: Secondary | ICD-10-CM

## 2020-06-11 LAB — POCT URINALYSIS DIPSTICK OB
Blood, UA: NEGATIVE
Glucose, UA: NEGATIVE
Ketones, UA: NEGATIVE
Leukocytes, UA: NEGATIVE
Nitrite, UA: NEGATIVE
POC,PROTEIN,UA: NEGATIVE

## 2020-06-11 NOTE — Progress Notes (Signed)
[place] 34+2 wks,cephalic,BPP 8/8,anterior placenta with a posterior fundal accessory lobe gr 3,2VC,FHR 133 BPM,AFI 16.5 cm,RI .55,.55=31%

## 2020-06-11 NOTE — Progress Notes (Signed)
HIGH-RISK PREGNANCY VISIT Patient name: Sheena [name] MRN [id number]  Date of birth: 1985/08/26 Chief Complaint:   Routine Prenatal Visit  History of Present Illness:   Sheena [name] is a 34 y.o. G3P0203 female at [redacted]w[redacted]d with an Estimated Date of Delivery: 07/21/20 being seen today for ongoing management of a high-risk pregnancy complicated by [facility].  Today she reports no complaints.  Depression screen [facility] 2/9 05/01/2020 03/06/2020  Decreased Interest 1 2  Down, Depressed, Hopeless 1 1  PHQ - 2 Score 2 3  Altered sleeping 2 2  Tired, decreased energy 1 3  Change in appetite 2 3  Feeling bad or failure about yourself  1 2  Trouble concentrating 1 2  Moving slowly or fidgety/restless 0 0  Suicidal thoughts 0 0  PHQ-9 Score 9 15  Difficult doing work/chores Not difficult at all -    Contractions: Irritability. Vag. Bleeding: None.  Movement: Present. denies leaking of fluid.  Review of Systems:   Pertinent items are noted in HPI Denies abnormal vaginal discharge w/ itching/odor/irritation, headaches, visual changes, shortness of breath, chest pain, abdominal pain, severe nausea/vomiting, or problems with urination or bowel movements unless otherwise stated above. Pertinent History Reviewed:  Reviewed past medical,surgical, social, obstetrical and family history.  Reviewed problem list, medications and allergies. Physical Assessment:   Vitals:   06/11/20 0936  BP: (!) 145/93  Pulse: 91  Weight: 170 lb (77.1 kg)  Body mass index is 29.18 kg/m.           Physical Examination:   General appearance: alert, well appearing, and in no distress  Mental status: alert, oriented to person, place, and time  Skin: warm & dry   Extremities: Edema: Trace    Cardiovascular: normal heart rate noted  Respiratory: normal respiratory effort, no distress  Abdomen: gravid, soft, non-tender  Pelvic: Cervical exam deferred         Fetal Status: Fetal Heart Rate (bpm): 133   Movement:  Present    Fetal Surveillance Testing today: BPP 8/8 with normal Dopplers   Chaperone: N/A    Results for orders placed or performed in visit on 06/11/20 (from the past 24 hour(s))  POC Urinalysis Dipstick OB   Collection Time: 06/11/20  9:34 AM  Result Value Ref Range   Color, UA     Clarity, UA     Glucose, UA Negative Negative   Bilirubin, UA     Ketones, UA neg    Spec Grav, UA     Blood, UA neg    pH, UA     POC,PROTEIN,UA Negative Negative, Trace, Small (1+), Moderate (2+), Large (3+), 4+   Urobilinogen, UA     Nitrite, UA neg    Leukocytes, UA Negative Negative   Appearance     Odor      Assessment & Plan:  1) High-risk pregnancy G3P0203 at [redacted]w[redacted]d with an Estimated Date of Delivery: 07/21/20   2) GHTN, no meds, stabl eBP,   3) SUA, stable  Meds: No orders of the defined types were placed in this encounter.   Labs/procedures today: BPP/Dopplers normal reassuring  Treatment Plan:  Twice weekly surveillance, no meds for now, IOL 37 weeks, 06/30/20 or as clinically indicated  Reviewed: Preterm labor symptoms and general obstetric precautions including but not limited to vaginal bleeding, contractions, leaking of fluid and fetal movement were reviewed in detail with the patient.  All questions were answered. Has home bp cuff. Rx faxed to . Check  bp weekly, let us know if >140/90.   Follow-up: Return in about 3 days (around 06/14/2020) for Nurse only, NST.  Orders Placed This Encounter  Procedures  . POC Urinalysis Dipstick OB   [name]  06/11/2020 9:52 AM

## 2020-06-13 ENCOUNTER — Telehealth: Payer: Self-pay | Admitting: Advanced Practice Midwife

## 2020-06-13 NOTE — Telephone Encounter (Signed)
Pt has a cold - cough, runny nose, sore throat, NO fever Her daughter has had a cold this past week, was negative for strep & negative for Covid  Pt wants to know what OTC's she can take?    Also wants to know if she should cancel her NST appt for tomorrow?  States she has bi-weekly appointments now & wonders if it would be ok to cancel tomorrow.  Please advise    [facility]

## 2020-06-14 ENCOUNTER — Other Ambulatory Visit: Payer: Medicaid Other

## 2020-06-16 ENCOUNTER — Other Ambulatory Visit: Payer: Self-pay

## 2020-06-16 ENCOUNTER — Inpatient Hospital Stay (HOSPITAL_COMMUNITY): Payer: Medicaid Other | Admitting: Anesthesiology

## 2020-06-16 ENCOUNTER — Encounter (HOSPITAL_COMMUNITY): Payer: Self-pay | Admitting: Obstetrics & Gynecology

## 2020-06-16 ENCOUNTER — Inpatient Hospital Stay (HOSPITAL_COMMUNITY)
Admission: AD | Admit: 2020-06-16 | Discharge: 2020-06-19 | DRG: 806 | Disposition: A | Discharge status: Home / Self Care | Payer: Medicaid Other | Attending: Obstetrics and Gynecology | Admitting: Obstetrics and Gynecology

## 2020-06-16 DIAGNOSIS — O1413 Severe pre-eclampsia, third trimester: Secondary | ICD-10-CM | POA: Diagnosis present

## 2020-06-16 DIAGNOSIS — O99334 Smoking (tobacco) complicating childbirth: Secondary | ICD-10-CM | POA: Diagnosis present

## 2020-06-16 DIAGNOSIS — O9962 Diseases of the digestive system complicating childbirth: Secondary | ICD-10-CM | POA: Diagnosis present

## 2020-06-16 DIAGNOSIS — Q270 Congenital absence and hypoplasia of umbilical artery: Secondary | ICD-10-CM

## 2020-06-16 DIAGNOSIS — F1290 Cannabis use, unspecified, uncomplicated: Secondary | ICD-10-CM | POA: Diagnosis present

## 2020-06-16 DIAGNOSIS — K219 Gastro-esophageal reflux disease without esophagitis: Secondary | ICD-10-CM | POA: Diagnosis present

## 2020-06-16 DIAGNOSIS — O99324 Drug use complicating childbirth: Secondary | ICD-10-CM | POA: Diagnosis present

## 2020-06-16 DIAGNOSIS — O285 Abnormal chromosomal and genetic finding on antenatal screening of mother: Secondary | ICD-10-CM | POA: Diagnosis present

## 2020-06-16 DIAGNOSIS — O09299 Supervision of pregnancy with other poor reproductive or obstetric history, unspecified trimester: Secondary | ICD-10-CM

## 2020-06-16 DIAGNOSIS — Z3A35 35 weeks gestation of pregnancy: Secondary | ICD-10-CM

## 2020-06-16 DIAGNOSIS — O1414 Severe pre-eclampsia complicating childbirth: Secondary | ICD-10-CM | POA: Diagnosis present

## 2020-06-16 DIAGNOSIS — Z20822 Contact with and (suspected) exposure to covid-19: Secondary | ICD-10-CM | POA: Diagnosis present

## 2020-06-16 DIAGNOSIS — O99284 Endocrine, nutritional and metabolic diseases complicating childbirth: Secondary | ICD-10-CM | POA: Diagnosis present

## 2020-06-16 DIAGNOSIS — O0990 Supervision of high risk pregnancy, unspecified, unspecified trimester: Secondary | ICD-10-CM

## 2020-06-16 DIAGNOSIS — O0932 Supervision of pregnancy with insufficient antenatal care, second trimester: Secondary | ICD-10-CM

## 2020-06-16 DIAGNOSIS — Z30017 Encounter for initial prescription of implantable subdermal contraceptive: Secondary | ICD-10-CM | POA: Diagnosis not present

## 2020-06-16 DIAGNOSIS — F17210 Nicotine dependence, cigarettes, uncomplicated: Secondary | ICD-10-CM | POA: Diagnosis present

## 2020-06-16 DIAGNOSIS — E7872 Smith-Lemli-Opitz syndrome: Secondary | ICD-10-CM | POA: Diagnosis present

## 2020-06-16 DIAGNOSIS — B009 Herpesviral infection, unspecified: Secondary | ICD-10-CM | POA: Diagnosis present

## 2020-06-16 HISTORY — DX: Depression, unspecified: F32.A

## 2020-06-16 LAB — URINALYSIS, ROUTINE W REFLEX MICROSCOPIC
Bacteria, UA: NONE SEEN
Glucose, UA: NEGATIVE mg/dL
Hgb urine dipstick: NEGATIVE
Ketones, ur: NEGATIVE mg/dL
Leukocytes,Ua: NEGATIVE
Nitrite: NEGATIVE
Protein, ur: 100 mg/dL — AB
Specific Gravity, Urine: 1.023 (ref 1.005–1.030)
pH: 6 (ref 5.0–8.0)

## 2020-06-16 LAB — CBC
HCT: 37.5 % (ref 36.0–46.0)
HCT: 35.6 % — ABNORMAL LOW (ref 36.0–46.0)
Hemoglobin: 13.2 g/dL (ref 12.0–15.0)
Hemoglobin: 12.5 g/dL (ref 12.0–15.0)
MCH: 30.8 pg (ref 26.0–34.0)
MCH: 31 pg (ref 26.0–34.0)
MCHC: 35.2 g/dL (ref 30.0–36.0)
MCHC: 35.1 g/dL (ref 30.0–36.0)
MCV: 87.4 fL (ref 80.0–100.0)
MCV: 88.3 fL (ref 80.0–100.0)
Platelets: 397 10*3/uL (ref 150–400)
Platelets: 391 10*3/uL (ref 150–400)
RBC: 4.29 MIL/uL (ref 3.87–5.11)
RBC: 4.03 MIL/uL (ref 3.87–5.11)
RDW: 11.8 % (ref 11.5–15.5)
RDW: 11.9 % (ref 11.5–15.5)
WBC: 18.4 10*3/uL — ABNORMAL HIGH (ref 4.0–10.5)
WBC: 21.3 10*3/uL — ABNORMAL HIGH (ref 4.0–10.5)
nRBC: 0 % (ref 0.0–0.2)
nRBC: 0 % (ref 0.0–0.2)

## 2020-06-16 LAB — RAPID URINE DRUG SCREEN, HOSP PERFORMED
Amphetamines: NOT DETECTED
Barbiturates: NOT DETECTED
Benzodiazepines: NOT DETECTED
Cocaine: NOT DETECTED
Opiates: NOT DETECTED
Tetrahydrocannabinol: POSITIVE — AB

## 2020-06-16 LAB — RESPIRATORY PANEL BY RT PCR (FLU A&B, COVID)
Influenza A by PCR: NEGATIVE
Influenza B by PCR: NEGATIVE
SARS Coronavirus 2 by RT PCR: NEGATIVE

## 2020-06-16 LAB — COMPREHENSIVE METABOLIC PANEL
ALT: 16 U/L (ref 0–44)
AST: 26 U/L (ref 15–41)
Albumin: 2.6 g/dL — ABNORMAL LOW (ref 3.5–5.0)
Alkaline Phosphatase: 212 U/L — ABNORMAL HIGH (ref 38–126)
Anion gap: 12 (ref 5–15)
BUN: 5 mg/dL — ABNORMAL LOW (ref 6–20)
CO2: 18 mmol/L — ABNORMAL LOW (ref 22–32)
Calcium: 9 mg/dL (ref 8.9–10.3)
Chloride: 104 mmol/L (ref 98–111)
Creatinine, Ser: 0.72 mg/dL (ref 0.44–1.00)
GFR, Estimated: >60 mL/min (ref >60)
Glucose, Bld: 101 mg/dL — ABNORMAL HIGH (ref 70–99)
Potassium: 3.7 mmol/L (ref 3.5–5.1)
Sodium: 134 mmol/L — ABNORMAL LOW (ref 135–145)
Total Bilirubin: 0.4 mg/dL (ref 0.3–1.2)
Total Protein: 7 g/dL (ref 6.5–8.1)

## 2020-06-16 LAB — TYPE AND SCREEN
ABO/RH(D): O POS
Antibody Screen: NEGATIVE

## 2020-06-16 LAB — PROTEIN / CREATININE RATIO, URINE
Creatinine, Urine: 217.53 mg/dL
Protein Creatinine Ratio: 0.29 mg/mg{Cre} — ABNORMAL HIGH (ref 0.00–0.15)
Total Protein, Urine: 63 mg/dL

## 2020-06-16 MED ORDER — BETAMETHASONE SOD PHOS & ACET 6 (3-3) MG/ML IJ SUSP: 12.0000 mg | Freq: Once | INTRAMUSCULAR | Status: DC

## 2020-06-16 MED ORDER — ACETAMINOPHEN 325 MG PO TABS: 650.0000 mg | ORAL_TABLET | ORAL | Status: DC | PRN

## 2020-06-16 MED ORDER — OXYCODONE-ACETAMINOPHEN 5-325 MG PO TABS: 2.0000 | ORAL_TABLET | ORAL | Status: DC | PRN

## 2020-06-16 MED ORDER — SODIUM CHLORIDE 0.9 % IV SOLN
5.0000 10*6.[IU] | Freq: Once | INTRAVENOUS | Status: AC
  Administered 2020-06-16: 5 10*6.[IU] via INTRAVENOUS
  Filled 2020-06-16 (×3): qty 5

## 2020-06-16 MED ORDER — MAGNESIUM SULFATE BOLUS VIA INFUSION
4.0000 g | Freq: Once | INTRAVENOUS | Status: AC
  Administered 2020-06-16: 4 g via INTRAVENOUS
  Filled 2020-06-16: qty 1000

## 2020-06-16 MED ORDER — SODIUM CHLORIDE (PF) 0.9 % IJ SOLN
INTRAMUSCULAR | Status: DC | PRN
  Administered 2020-06-16: 12 mL/h via EPIDURAL

## 2020-06-16 MED ORDER — OXYTOCIN-SODIUM CHLORIDE 30-0.9 UT/500ML-% IV SOLN
2.5000 [IU]/h | INTRAVENOUS | Status: DC
  Filled 2020-06-16: qty 500

## 2020-06-16 MED ORDER — LABETALOL HCL 5 MG/ML IV SOLN
40.0000 mg | INTRAVENOUS | Status: DC | PRN
  Administered 2020-06-17: 40 mg via INTRAVENOUS
  Filled 2020-06-16: qty 8

## 2020-06-16 MED ORDER — FENTANYL-BUPIVACAINE-NACL 0.5-0.125-0.9 MG/250ML-% EP SOLN
EPIDURAL | Status: AC
  Filled 2020-06-16: qty 250

## 2020-06-16 MED ORDER — BETAMETHASONE SOD PHOS & ACET 6 (3-3) MG/ML IJ SUSP
12.0000 mg | Freq: Once | INTRAMUSCULAR | Status: AC
  Administered 2020-06-16: 12 mg via INTRAMUSCULAR
  Filled 2020-06-16: qty 5

## 2020-06-16 MED ORDER — LIDOCAINE HCL (PF) 1 % IJ SOLN: 30.0000 mL | INTRAMUSCULAR | Status: DC | PRN

## 2020-06-16 MED ORDER — SOD CITRATE-CITRIC ACID 500-334 MG/5ML PO SOLN: 30.0000 mL | ORAL | Status: DC | PRN

## 2020-06-16 MED ORDER — HYDRALAZINE HCL 20 MG/ML IJ SOLN: 10.0000 mg | INTRAMUSCULAR | Status: DC | PRN

## 2020-06-16 MED ORDER — LIDOCAINE HCL (PF) 1 % IJ SOLN
INTRAMUSCULAR | Status: DC | PRN
  Administered 2020-06-16: 10 mL via EPIDURAL

## 2020-06-16 MED ORDER — ACETAMINOPHEN 500 MG PO TABS
1000.0000 mg | ORAL_TABLET | Freq: Once | ORAL | Status: AC
  Administered 2020-06-16: 1000 mg via ORAL
  Filled 2020-06-16: qty 2

## 2020-06-16 MED ORDER — LABETALOL HCL 5 MG/ML IV SOLN
20.0000 mg | INTRAVENOUS | Status: DC | PRN
  Administered 2020-06-17: 20 mg via INTRAVENOUS
  Filled 2020-06-16: qty 4

## 2020-06-16 MED ORDER — MAGNESIUM SULFATE 40 GM/1000ML IV SOLN
2.0000 g/h | INTRAVENOUS | Status: DC
  Administered 2020-06-16 – 2020-06-17 (×2): 2 g/h via INTRAVENOUS
  Filled 2020-06-16: qty 1000

## 2020-06-16 MED ORDER — OXYCODONE-ACETAMINOPHEN 5-325 MG PO TABS: 1.0000 | ORAL_TABLET | ORAL | Status: DC | PRN

## 2020-06-16 MED ORDER — LABETALOL HCL 5 MG/ML IV SOLN
INTRAVENOUS | Status: AC
  Administered 2020-06-16: 20 mg
  Filled 2020-06-16: qty 4

## 2020-06-16 MED ORDER — TERBUTALINE SULFATE 1 MG/ML IJ SOLN: 0.2500 mg | Freq: Once | INTRAMUSCULAR | Status: DC | PRN

## 2020-06-16 MED ORDER — LACTATED RINGERS IV SOLN
500.0000 mL | INTRAVENOUS | Status: DC | PRN
  Administered 2020-06-16: 500 mL via INTRAVENOUS

## 2020-06-16 MED ORDER — LABETALOL HCL 5 MG/ML IV SOLN: 80.0000 mg | INTRAVENOUS | Status: DC | PRN

## 2020-06-16 MED ORDER — MISOPROSTOL 50MCG HALF TABLET
50.0000 ug | ORAL_TABLET | ORAL | Status: DC | PRN
  Administered 2020-06-16 (×2): 50 ug via BUCCAL
  Filled 2020-06-16 (×2): qty 1

## 2020-06-16 MED ORDER — ONDANSETRON HCL 4 MG/2ML IJ SOLN: 4.0000 mg | Freq: Four times a day (QID) | INTRAMUSCULAR | Status: DC | PRN

## 2020-06-16 MED ORDER — LACTATED RINGERS IV SOLN: INTRAVENOUS | Status: DC

## 2020-06-16 MED ORDER — OXYTOCIN BOLUS FROM INFUSION
333.0000 mL | Freq: Once | INTRAVENOUS | Status: AC
  Administered 2020-06-17: 333 mL via INTRAVENOUS

## 2020-06-16 MED ORDER — HYDROXYZINE HCL 50 MG PO TABS
50.0000 mg | ORAL_TABLET | Freq: Once | ORAL | Status: AC
  Administered 2020-06-16: 50 mg via ORAL
  Filled 2020-06-16: qty 1

## 2020-06-16 MED ORDER — PHENYLEPHRINE 40 MCG/ML (10ML) SYRINGE FOR IV PUSH (FOR BLOOD PRESSURE SUPPORT)
PREFILLED_SYRINGE | INTRAVENOUS | Status: AC
  Filled 2020-06-16: qty 10

## 2020-06-16 MED ORDER — PENICILLIN G POT IN DEXTROSE 60000 UNIT/ML IV SOLN
3.0000 10*6.[IU] | INTRAVENOUS | Status: DC
  Administered 2020-06-16 – 2020-06-17 (×3): 3 10*6.[IU] via INTRAVENOUS
  Filled 2020-06-16 (×3): qty 50

## 2020-06-16 NOTE — Anesthesia Preprocedure Evaluation (Signed)
Anesthesia Evaluation  Patient identified by MRN, date of birth, ID band Patient awake    Reviewed: Allergy & Precautions, NPO status , Patient's Chart, lab work & pertinent test results  Airway Mallampati: I       Dental no notable dental hx.    Pulmonary Current Smoker,    Pulmonary exam normal        Cardiovascular hypertension, Normal cardiovascular exam     Neuro/Psych negative neurological ROS     GI/Hepatic GERD  Medicated,  Endo/Other  negative endocrine ROS  Renal/GU negative Renal ROS  negative genitourinary   Musculoskeletal negative musculoskeletal ROS (+)   Abdominal Normal abdominal exam  (+)   Peds  Hematology negative hematology ROS (+)   Anesthesia Other Findings   Reproductive/Obstetrics                             Anesthesia Physical Anesthesia Plan  ASA: II  Anesthesia Plan: Epidural   Post-op Pain Management:    Induction:   PONV Risk Score and Plan:   Airway Management Planned:   Additional Equipment:   Intra-op Plan:   Post-operative Plan:   Informed Consent: I have reviewed the patients History and Physical, chart, labs and discussed the procedure including the risks, benefits and alternatives for the proposed anesthesia with the patient or authorized representative who has indicated his/her understanding and acceptance.       Plan Discussed with:   Anesthesia Plan Comments:         Anesthesia Quick Evaluation

## 2020-06-16 NOTE — Progress Notes (Signed)
Labor Progress Note KAJOL CRISPEN is a 34 y.o. N2T5573 at [redacted]w[redacted]d presented for symptoms of PEC. S: HA improved but noted some blurry vision earlier, now improved. Feeling comfortable with epidural in place.   O:  BP 128/77   Pulse 85   Temp 98 F (36.7 C) (Oral)   Resp 20   Ht 5\' 4"  (1.626 m)   Wt 76.8 kg   LMP  (LMP Unknown)   SpO2 100%   BMI 29.06 kg/m  EFM: baseline 125/mod variability/pos accels/ neg decels   CVE: Dilation: 3 Effacement (%): 50 Station: -3 Presentation: Vertex Exam by:: Dr [email or web address], MD    A&P: 34 y.o. 32 [redacted]w[redacted]d her for IOL for PEC w SF.   #Induction of Labor: Progressing well. S/p Cytotec x1, proceed w additional dose cytotec then transition to pitocin.    #PEC w SF: based on HA, SR pressures. PEC labs normal. On magnesium.   --Severe range pressures following epidural placement, requiring IV labetalol. Continue to monitor.   #H/o PPH: in s/o twin delivery; required blood transfusion -low threshold for TXA at time of delivery  #Pain: epidural in place #FWB: Cat I  #GBS pending, PCN given preterm    [redacted]w[redacted]d, MD 11:55 PM

## 2020-06-16 NOTE — Anesthesia Procedure Notes (Signed)
Epidural Patient location during procedure: OB Start time: 06/16/2020 10:40 PM End time: 06/16/2020 10:43 PM  Staffing Anesthesiologist: Leilani Able, MD Performed: anesthesiologist   Preanesthetic Checklist Completed: patient identified, IV checked, site marked, risks and benefits discussed, surgical consent, monitors and equipment checked, pre-op evaluation and timeout performed  Epidural Patient position: sitting Prep: DuraPrep and site prepped and draped Patient monitoring: continuous pulse ox and blood pressure Approach: midline Location: L3-L4 Injection technique: LOR air  Needle:  Needle type: Tuohy  Needle gauge: 17 G Needle length: 9 cm and 9 Needle insertion depth: 5 cm cm Catheter type: closed end flexible Catheter size: 19 Gauge Catheter at skin depth: 10 cm Test dose: negative and Other  Assessment Events: blood not aspirated, injection not painful, no injection resistance, no paresthesia and negative IV test  Additional Notes Reason for block:procedure for pain

## 2020-06-16 NOTE — MAU Note (Signed)
Sheena [name] is a 34 y.o. at [redacted]w[redacted]d here in MAU reporting: has been checking her BP today as she normally does and after a few checks she called the office today and they told her to come. Has a headache, no visual changes. No bleeding, no LOF. +FM  Onset of complaint: today  Pain score: 1/10  Vitals:   06/16/20 1152  BP: (!) 145/97  Pulse: (!) 111  Resp: 15  Temp: 98.5 F (36.9 C)  SpO2: 99%     FHT: +FM  Lab orders placed from triage: UA

## 2020-06-16 NOTE — Progress Notes (Signed)
Asked by [facility] to provide prenatal consultation for 34 y.o. 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  She is being treated with betamethasone (first dose 1430 today) and MgSO4 in addition to PCN for GBS prophylaxis (unknown status) antihypertensives.  Discussed with patient and FOB usual expectations for preterm infant at [redacted] weeks gestation, including possibility he might not need transfer to NICU depending on his birth weight, respiratory status, and feeding. Presented possible need for respiratory support and IV access.  Presented usual criteria for discharge.  Discussed advantages of feeding with mother's milk and she plans to breast feed.  Patient and FOB were attentive, had appropriate questions, and were appreciative of my input.  Thank you for consulting Neonatology.  Total time 25, face-to-face time 15 minutes  JWimmer, MD

## 2020-06-16 NOTE — H&P (Addendum)
LABOR AND DELIVERY ADMISSION HISTORY AND PHYSICAL NOTE  Sheena [name] is a 34 y.o. female G3P0203 with IUP at 110w0d by 18 wk U/S presenting for IOL secondary to preeclampsia with severe features based on severe headache.   She reports positive fetal movement. She denies leakage of fluid or vaginal bleeding.  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)  Past Medical History: Past Medical History:  Diagnosis Date   Blood transfusion without reported diagnosis    Depression    little bit, no wanting to hurt self or others   GERD (gastroesophageal reflux disease)    Panic attacks    Preeclampsia complicating hypertension    Past Surgical History: Past Surgical History:  Procedure Laterality Date   NO PAST SURGERIES      Obstetrical History: OB History     Gravida  3   Para  2   Term  0   Preterm  2   AB      Living  3      SAB      TAB      Ectopic      Multiple  1   Live Births  3           Social History: Social History   Socioeconomic History   Marital status: Married    Spouse name: Not on file   Number of children: Not on file   Years of education: Not on file   Highest education level: Not on file  Occupational History   Not on file  Tobacco Use   Smoking status: Current Every Day Smoker    Packs/day: 2.00    Years: 20.00    Pack years: 40.00    Types: Cigarettes   Smokeless tobacco: Never Used  Building services engineer Use: Never used  Substance and Sexual Activity   Alcohol use: Not Currently    Alcohol/week: 12.0 standard drinks    Types: 12 Cans of beer per week    Comment: daily   Drug use: Yes    Frequency:  7.0 times per week    Types: Marijuana    Comment: once a day   Sexual activity: Not Currently    Birth control/protection: None  Other Topics Concern   Not on file  Social History Narrative   Not on file   Social Determinants of Health   Financial Resource Strain: Low Risk    Difficulty of Paying Living Expenses: Not hard at all  Food Insecurity: No Food Insecurity   Worried About Programme researcher, broadcasting/film/video in the Last Year: Never true   Ran Out of Food in the Last Year: Never true  Transportation Needs: No Transportation Needs   Lack of Transportation (Medical): No   Lack of Transportation (Non-Medical): No  Physical Activity: Inactive   Days of Exercise per Week: 0 days   Minutes of Exercise per Session: 0 min  Stress: Stress Concern Present   Feeling of Stress : Very much  Social Connections: Moderately Isolated   Frequency of Communication with Friends and Family: Twice a week   Frequency of Social Gatherings with Friends and Family: Once a week   Attends Religious Services: Never   Active Member  of Clubs or Organizations: No   Attends BankerClub or Organization Meetings: Never   Marital Status: Living with partner    Family History: Family History  Problem Relation Age of Onset   Cancer Mother        colon   Healthy Father    Colon polyps Sister    Diabetes Maternal Grandmother    Dementia Paternal Grandmother     Allergies: No Known Allergies  Medications Prior to Admission  Medication Sig Dispense Refill Last Dose   acetaminophen (TYLENOL) 500 MG tablet Take 500 mg by mouth every 6 (six) hours as needed.   06/15/2020 at Unknown time   aspirin EC 81 MG tablet Take 81 mg by mouth in the morning and at bedtime. Swallow whole.   06/16/2020 at Unknown time   guaiFENesin (MUCINEX) 600 MG 12 hr tablet Take by mouth 2 (two) times daily.   06/16/2020 at Unknown time   omeprazole (PRILOSEC) 20 MG capsule Take 20 mg by mouth in the morning.   06/16/2020 at Unknown time    Prenatal Vit-Fe Fumarate-FA (PRENATAL MULTIVITAMIN) TABS tablet Take 1 tablet by mouth daily at 12 noon.   06/16/2020 at Unknown time   Blood Pressure Monitor MISC For regular home bp monitoring during pregnancy 1 each 0      Review of Systems  All systems reviewed and negative except as stated in HPI  Physical Exam Blood pressure (!) 141/95, pulse 97, temperature 98.5 F (36.9 C), temperature source Oral, resp. rate 15, height 5\' 4"  (1.626 m), weight 76.8 kg, SpO2 99 %. General appearance: alert, oriented, NAD HEENT: poor dentition Lungs: normal respiratory effort Heart: regular rate Abdomen: soft, non-tender; gravid, FH appropriate for GA Pelvic exam: VULVA: normal appearing vulva with no masses, tenderness or lesions, VAGINA: normal appearing vagina with normal color and discharge, no lesions, CERVIX: normal appearing cervix without discharge or lesions. Extremities: No calf swelling or tenderness Presentation: cephalic on bedside ultrasound Fetal monitoring: 130 bpm, moderate variability 15 x 15 acels, no decels Uterine activity: minimal   Prenatal labs: ABO, Rh: O/Positive/-- (08/04 1543) Antibody: Negative (09/29 0835) Rubella: 1.01 (08/04 1543) RPR: Non Reactive (09/29 0835)  HBsAg: Negative (08/04 1543)  HIV: Non Reactive (09/29 0835)  GC/Chlamydia: Negative 03/06/2020 GBS:  unknown 2-hr GTT: wnl Genetic screening: Panorama low risk female Anatomy US: Female, 2 vessel cord, left ventricular echogenic intracardiac focus  Prenatal Transfer Tool  Maternal Diabetes: No Genetic Screening: Normal Maternal Ultrasounds/Referrals: Isolated EIF (echogenic intracardiac focus) and Other: 2 vessel cord Fetal Ultrasounds or other Referrals:  None Maternal Substance Abuse:  Yes:  Type: Smoker, Marijuana Significant Maternal Medications:  None Significant Maternal Lab Results: Other:  HSV-2 + (no lesions on admission, no suppression) , GBS unknown; 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    Results for orders placed or performed during the hospital encounter of 06/16/20 (from the past 24 hour(s))  Urinalysis, Routine w reflex microscopic Urine, Clean Catch   Collection Time: 06/16/20 11:54 AM  Result Value Ref Range   Color, Urine AMBER (A) YELLOW   APPearance HAZY (A) CLEAR   Specific Gravity, Urine 1.023 1.005 - 1.030   pH 6.0 5.0 - 8.0   Glucose, UA NEGATIVE NEGATIVE mg/dL   Hgb urine dipstick NEGATIVE NEGATIVE   Bilirubin Urine MODERATE (A) NEGATIVE   Ketones, ur NEGATIVE NEGATIVE mg/dL   Protein, ur 811100 (A) NEGATIVE mg/dL   Nitrite NEGATIVE NEGATIVE   Leukocytes,Ua NEGATIVE NEGATIVE   RBC / HPF 0-5 0 -  5 RBC/hpf   WBC, UA 0-5 0 - 5 WBC/hpf   Bacteria, UA NONE SEEN NONE SEEN   Squamous Epithelial / LPF 0-5 0 - 5  Protein / creatinine ratio, urine   Collection Time: 06/16/20 11:54 AM  Result Value Ref Range   Creatinine, Urine 217.53 mg/dL   Total Protein, Urine 63 mg/dL   Protein Creatinine Ratio 0.29 (H) 0.00 - 0.15 mg/mg[Cre]  CBC   Collection Time: 06/16/20 12:02 PM  Result Value Ref Range   WBC 18.4 (H) 4.0 - 10.5 K/uL   RBC 4.29 3.87 - 5.11 MIL/uL   Hemoglobin 13.2 12.0 - 15.0 g/dL   HCT 12.7 36 - 46 %   MCV 87.4 80.0 - 100.0 fL   MCH 30.8 26.0 - 34.0 pg   MCHC 35.2 30.0 - 36.0 g/dL   RDW 51.7 00.1 - 74.9 %   Platelets 397 150 - 400 K/uL   nRBC 0.0 0.0 - 0.2 %  Comprehensive metabolic panel   Collection Time: 06/16/20 12:02 PM  Result Value Ref Range   Sodium 134 (L) 135 - 145 mmol/L   Potassium 3.7 3.5 - 5.1 mmol/L   Chloride 104 98 - 111 mmol/L   CO2 18 (L) 22 - 32 mmol/L   Glucose, Bld 101 (H) 70 - 99 mg/dL   BUN 5 (L) 6 - 20 mg/dL   Creatinine, Ser 4.49 0.44 - 1.00 mg/dL   Calcium 9.0 8.9 - 67.5 mg/dL   Total Protein 7.0 6.5 - 8.1 g/dL   Albumin 2.6 (L) 3.5 - 5.0 g/dL   AST 26 15 - 41 U/L   ALT 16 0 - 44 U/L   Alkaline Phosphatase 212 (H) 38 - 126 U/L   Total Bilirubin 0.4 0.3 - 1.2 mg/dL   GFR, Estimated >91 >63 mL/min   Anion  gap 12 5 - 15    Patient Active Problem List   Diagnosis Date Noted   Gestational hypertension 06/07/2020   Cervical high risk human papillomavirus (HPV) DNA test positive 03/20/2020   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 [id number] at [redacted]w[redacted]d here for IOL 2/2 severe preeclampsia based on severe headache.   Pre-E w/ SF: Pt presented to MAU with concern of severe HA and mild range blood pressures. H/o Preeclampsia in prior pregnancy. S/p BMZ in MAU with plan for second dose in 24hrs. -F/u UP:C, CMP, CBC -Mag 4g x1 followed by 2 g/hr -Monitor BP; IV antihypertensives as clinically indicated  HSV-2: Pt denies prior history of outbreaks. Speculum exam w/o cervical lesions on admission. Pt denies prodromal symptoms.  H/o PPH: in s/o twin delivery; required blood transfusion -low threshold for TXA at time of delivery  Tobacco & Marijuana Use in Pregnancy: Plan for postpartum SW consult.  Preterm Labor: FB in place 1700, buccal cytotec x1 in 30 minutes for FWB. Will plan to transition to pitocin with cervical exam more favorable. #Pain: pt plans for epidural #FWB: Category 1 strip #ID: GBS unknown; PCN on admission given preterm, f/u GBS swab (collected on admission) #MOF: Both #MOC: plan for removal of  Implanon (placed 12 years ago) with subsequent inpatient Nexplanon insertion #Circ: Yes; pt considering inpatient circ vs outpatient at [facility] 06/16/2020, 5:22 PM  Attestation of Supervision of Student:  I confirm that I have verified the information  documented in the  resident's  note and that I have also personally reperformed the history, physical exam and all medical decision making activities.  I have verified that all services and findings are accurately documented in this student's note; and I agree with management and plan as outlined in the documentation. I have also made any necessary editorial changes.  Sheila Oats, MD [facility], [facility] 06/16/2020 6:55 PM

## 2020-06-16 NOTE — MAU Provider Note (Signed)
History     CSN: [id number]  Arrival date and time: 06/16/20 1139   First Provider Initiated Contact with Patient 06/16/20 1300      Chief Complaint  Patient presents with   Hypertension   Headache   HPI   [name] is a 34 y.o. @ [redacted]w[redacted]d with a history of PRE E with previous pregnancies with preterm delivery,  and Gestational HTN this pregnancy. She has been checking her BP at home and the last 2 days her BP has been elevated. The highest reading at home was 159/104 & 174/107. She reports HA and scotoma. She reports vision changes for months. She has a history of HA's and reports them frequently outside of pregnancy. She reports her HA pain fo 3/10. She has not taken anything for the HA. No upper abdominal pain. + fetal movement.    OB History    Gravida  3   Para  2   Term  0   Preterm  2   AB      Living  3     SAB      TAB      Ectopic      Multiple  1   Live Births  3           Past Medical History:  Diagnosis Date   Blood transfusion without reported diagnosis    Depression    little bit, no wanting to hurt self or others   GERD (gastroesophageal reflux disease)    Panic attacks    Preeclampsia complicating hypertension     Past Surgical History:  Procedure Laterality Date   NO PAST SURGERIES      Family History  Problem Relation Age of Onset   Cancer Mother        colon   Healthy Father    Colon polyps Sister    Diabetes Maternal Grandmother    Dementia Paternal Grandmother     Social History   Tobacco Use   Smoking status: Current Every Day Smoker    Packs/day: 2.00    Years: 20.00    Pack years: 40.00    Types: Cigarettes   Smokeless tobacco: Never Used  Building services engineer Use: Never used  Substance Use Topics   Alcohol use: Not Currently    Alcohol/week: 12.0 standard drinks    Types: 12 Cans of beer per week    Comment: daily   Drug use: Yes    Frequency: 7.0 times per week    Types:  Marijuana    Comment: once a day    Allergies: No Known Allergies  Medications Prior to Admission  Medication Sig Dispense Refill Last Dose   acetaminophen (TYLENOL) 500 MG tablet Take 500 mg by mouth every 6 (six) hours as needed.   06/15/2020 at Unknown time   aspirin EC 81 MG tablet Take 81 mg by mouth in the morning and at bedtime. Swallow whole.   06/16/2020 at Unknown time   guaiFENesin (MUCINEX) 600 MG 12 hr tablet Take by mouth 2 (two) times daily.   06/16/2020 at Unknown time   omeprazole (PRILOSEC) 20 MG capsule Take 20 mg by mouth in the morning.   06/16/2020 at Unknown time   Prenatal Vit-Fe Fumarate-FA (PRENATAL MULTIVITAMIN) TABS tablet Take 1 tablet by mouth daily at 12 noon.   06/16/2020 at Unknown time   Blood Pressure Monitor MISC For regular home bp monitoring during pregnancy 1 each 0  Results for orders placed or performed during the hospital encounter of 06/16/20 (from the past 48 hour(s))  Urinalysis, Routine w reflex microscopic Urine, Clean Catch     Status: Abnormal   Collection Time: 06/16/20 11:54 AM  Result Value Ref Range   Color, Urine AMBER (A) YELLOW    Comment: BIOCHEMICALS MAY BE AFFECTED BY COLOR   APPearance HAZY (A) CLEAR   Specific Gravity, Urine 1.023 1.005 - 1.030   pH 6.0 5.0 - 8.0   Glucose, UA NEGATIVE NEGATIVE mg/dL   Hgb urine dipstick NEGATIVE NEGATIVE   Bilirubin Urine MODERATE (A) NEGATIVE   Ketones, ur NEGATIVE NEGATIVE mg/dL   Protein, ur 937 (A) NEGATIVE mg/dL   Nitrite NEGATIVE NEGATIVE   Leukocytes,Ua NEGATIVE NEGATIVE   RBC / HPF 0-5 0 - 5 RBC/hpf   WBC, UA 0-5 0 - 5 WBC/hpf   Bacteria, UA NONE SEEN NONE SEEN   Squamous Epithelial / LPF 0-5 0 - 5    Comment: Performed at [facility], [address] [place], [place] [address]  Protein / creatinine ratio, urine     Status: Abnormal   Collection Time: 06/16/20 11:54 AM  Result Value Ref Range   Creatinine, Urine 217.53 mg/dL   Total Protein, Urine 63 mg/dL     Comment: NO NORMAL RANGE ESTABLISHED FOR THIS TEST   Protein Creatinine Ratio 0.29 (H) 0.00 - 0.15 mg/mg[Cre]    Comment: Performed at [facility], [address] [place], [place] [address]  CBC     Status: Abnormal   Collection Time: 06/16/20 12:02 PM  Result Value Ref Range   WBC 18.4 (H) 4.0 - 10.5 K/uL   RBC 4.29 3.87 - 5.11 MIL/uL   Hemoglobin 13.2 12.0 - 15.0 g/dL   HCT 01.7 36 - 46 %   MCV 87.4 80.0 - 100.0 fL   MCH 30.8 26.0 - 34.0 pg   MCHC 35.2 30.0 - 36.0 g/dL   RDW 51.0 25.8 - 52.7 %   Platelets 397 150 - 400 K/uL   nRBC 0.0 0.0 - 0.2 %    Comment: Performed at [facility], [address] [place], [place] [address]  Comprehensive metabolic panel     Status: Abnormal   Collection Time: 06/16/20 12:02 PM  Result Value Ref Range   Sodium 134 (L) 135 - 145 mmol/L   Potassium 3.7 3.5 - 5.1 mmol/L   Chloride 104 98 - 111 mmol/L   CO2 18 (L) 22 - 32 mmol/L   Glucose, Bld 101 (H) 70 - 99 mg/dL    Comment: Glucose reference range applies only to samples taken after fasting for at least 8 hours.   BUN 5 (L) 6 - 20 mg/dL   Creatinine, Ser 3.53 0.44 - 1.00 mg/dL   Calcium 9.0 8.9 - 61.4 mg/dL   Total Protein 7.0 6.5 - 8.1 g/dL   Albumin 2.6 (L) 3.5 - 5.0 g/dL   AST 26 15 - 41 U/L   ALT 16 0 - 44 U/L   Alkaline Phosphatase 212 (H) 38 - 126 U/L   Total Bilirubin 0.4 0.3 - 1.2 mg/dL   GFR, Estimated >43 >15 mL/min    Comment: (NOTE) Calculated using the CKD-EPI Creatinine Equation (2021)    Anion gap 12 5 - 15    Comment: Performed at [facility], [address], [place], [place] [address]  Respiratory Panel by RT PCR (Flu A&B, Covid) - Nasopharyngeal Swab  Status: None   Collection Time: 06/16/20  2:32 PM   Specimen: Nasopharyngeal Swab  Result Value Ref Range   SARS Coronavirus 2 by RT PCR NEGATIVE NEGATIVE    Comment: (NOTE) SARS-CoV-2 target nucleic acids are NOT DETECTED.  The SARS-CoV-2 RNA is generally detectable in upper  respiratoy specimens during the acute phase of infection. The lowest concentration of SARS-CoV-2 viral copies this assay can detect is 131 copies/mL. A negative result does not preclude SARS-Cov-2 infection and should not be used as the sole basis for treatment or other patient management decisions. A negative result may occur with  improper specimen collection/handling, submission of specimen other than nasopharyngeal swab, presence of viral mutation(s) within the areas targeted by this assay, and inadequate number of viral copies (<131 copies/mL). A negative result must be combined with clinical observations, patient history, and epidemiological information. The expected result is Negative.  Fact Sheet for Patients:  https://www.moore.com/  Fact Sheet for Healthcare Providers:  https://www.young.biz/  This test is no t yet approved or cleared by the [place] FDA and  has been authorized for detection and/or diagnosis of SARS-CoV-2 by FDA under an Emergency Use Authorization (EUA). This EUA will remain  in effect (meaning this test can be used) for the duration of the COVID-19 declaration under Section 564(b)(1) of the Act, 21 U.S.C. section 360bbb-3(b)(1), unless the authorization is terminated or revoked sooner.     Influenza A by PCR NEGATIVE NEGATIVE   Influenza B by PCR NEGATIVE NEGATIVE    Comment: (NOTE) The Xpert Xpress SARS-CoV-2/FLU/RSV assay is intended as an aid in  the diagnosis of influenza from Nasopharyngeal swab specimens and  should not be used as a sole basis for treatment. Nasal washings and  aspirates are unacceptable for Xpert Xpress SARS-CoV-2/FLU/RSV  testing.  Fact Sheet for Patients: https://www.moore.com/  Fact Sheet for Healthcare Providers: https://www.young.biz/  This test is not yet approved or cleared by the [place] FDA and  has been authorized for  detection and/or diagnosis of SARS-CoV-2 by  FDA under an Emergency Use Authorization (EUA). This EUA will remain  in effect (meaning this test can be used) for the duration of the  Covid-19 declaration under Section 564(b)(1) of the Act, 21  U.S.C. section 360bbb-3(b)(1), unless the authorization is  terminated or revoked. Performed at [facility], [address], [place], [place] [address]   Type and screen [facility]     Status: None   Collection Time: 06/16/20  3:15 PM  Result Value Ref Range   ABO/RH(D) O POS    Antibody Screen NEG    Sample Expiration      06/19/2020,2359 Performed at [facility], [address], [place], [place] [address]     Review of Systems  Eyes: Positive for visual disturbance.  Gastrointestinal: Negative for abdominal pain.  Genitourinary: Negative for vaginal bleeding and vaginal discharge.  Neurological: Positive for headaches.   Physical Exam   Blood pressure (!) 147/98, pulse 98, temperature 98.5 F (36.9 C), temperature source Oral, resp. rate 15, height 5\' 4"  (1.626 m), weight 76.8 kg, SpO2 100 %.   Patient Vitals for the past 24 hrs:  BP Temp Temp src Pulse Resp SpO2 Height Weight  06/16/20 1700 (!) 157/96 -- -- 97 16 -- -- --  06/16/20 1600 (!) 134/99 -- -- (!) 103 18 -- -- --  06/16/20 1540 131/90 -- -- (!) 112 18 98 % -- --  06/16/20 1530 131/79 -- -- 06/18/20  115 18 98 % -- --  06/16/20 1525 (!) 143/83 -- -- (!) 113 18 98 % -- --  06/16/20 1520 (!) 157/97 98.9 F (37.2 C) Axillary (!) 111 18 99 % -- --  06/16/20 1435 -- -- -- (!) 126 18 100 % -- --  06/16/20 1416 (!) 131/91 -- -- 88 -- -- -- --  06/16/20 1415 -- -- -- -- -- 99 % -- --  06/16/20 1401 (!) 141/95 -- -- 97 -- -- -- --  06/16/20 1400 -- -- -- -- -- 99 % -- --  06/16/20 1346 (!) 148/89 -- -- 79 -- -- -- --  06/16/20 1331 (!) 143/87 -- -- 81 -- -- -- --  06/16/20 1318 (!) 91/43 -- -- (!) 140 -- -- -- --  06/16/20 1301 (!) 147/98 -- -- 98 --  -- -- --  06/16/20 1246 (!) 146/89 -- -- (!) 104 -- -- -- --  06/16/20 1231 (!) 145/94 -- -- (!) 104 -- -- -- --  06/16/20 1230 -- -- -- -- -- 100 % -- --  06/16/20 1227 (!) 142/102 -- -- 90 -- -- -- --  06/16/20 1152 (!) 145/97 98.5 F (36.9 C) Oral (!) 111 15 99 % -- --  06/16/20 1146 -- -- -- -- -- -- 5\' 4"  (1.626 m) 76.8 kg    Physical Exam Constitutional:      General: She is not in acute distress.    Appearance: She is well-developed. She is not ill-appearing or toxic-appearing.  HENT:     Head: Normocephalic.  Eyes:     Pupils: Pupils are equal, round, and reactive to light.  Abdominal:     Palpations: Abdomen is soft.     Tenderness: There is no abdominal tenderness.  Musculoskeletal:        General: Normal range of motion.     Cervical back: Normal range of motion and neck supple.  Skin:    General: Skin is warm.  Neurological:     Mental Status: She is alert and oriented to person, place, and time.     Deep Tendon Reflexes: Reflexes normal (No clonus ).  Psychiatric:        Mood and Affect: Mood is anxious.        Behavior: Behavior normal.   Fetal Tracing Baseline: Variability: Accelerations:  Decelerations: Toco: MAU Course  Procedures  None  MDM  Tylenol 1 gram given PO for HA. Patient reports no improvement in HA and reports worsening HA pain.  PIH labs ordered Discussed admission with Dr. Macon LargeAnyanwu; admit for induction of labor for severe preeclampsia Magnesium & BMZ ordered.   Assessment and Plan   A:  1. Severe preeclampsia, third trimester   2. [redacted] weeks gestation of pregnancy     P:  Admit for induction of labor Magnesium ordered Plan for vaginal delivery.   Venia Carbonasch, Antoine Fiallos I, NP 06/16/2020 6:04 PM

## 2020-06-17 DIAGNOSIS — O1414 Severe pre-eclampsia complicating childbirth: Secondary | ICD-10-CM

## 2020-06-17 DIAGNOSIS — Z3A35 35 weeks gestation of pregnancy: Secondary | ICD-10-CM

## 2020-06-17 LAB — CBC
HCT: 35.9 % — ABNORMAL LOW (ref 36.0–46.0)
Hemoglobin: 12.4 g/dL (ref 12.0–15.0)
MCH: 30.8 pg (ref 26.0–34.0)
MCHC: 34.5 g/dL (ref 30.0–36.0)
MCV: 89.1 fL (ref 80.0–100.0)
Platelets: 397 10*3/uL (ref 150–400)
RBC: 4.03 MIL/uL (ref 3.87–5.11)
RDW: 11.9 % (ref 11.5–15.5)
WBC: 30 10*3/uL — ABNORMAL HIGH (ref 4.0–10.5)
nRBC: 0 % (ref 0.0–0.2)

## 2020-06-17 LAB — COMPREHENSIVE METABOLIC PANEL
ALT: 15 U/L (ref 0–44)
AST: 24 U/L (ref 15–41)
Albumin: 2.7 g/dL — ABNORMAL LOW (ref 3.5–5.0)
Alkaline Phosphatase: 212 U/L — ABNORMAL HIGH (ref 38–126)
Anion gap: 15 (ref 5–15)
BUN: 5 mg/dL — ABNORMAL LOW (ref 6–20)
CO2: 18 mmol/L — ABNORMAL LOW (ref 22–32)
Calcium: 7.6 mg/dL — ABNORMAL LOW (ref 8.9–10.3)
Chloride: 94 mmol/L — ABNORMAL LOW (ref 98–111)
Creatinine, Ser: 0.78 mg/dL (ref 0.44–1.00)
GFR, Estimated: >60 mL/min (ref >60)
Glucose, Bld: 119 mg/dL — ABNORMAL HIGH (ref 70–99)
Potassium: 4.4 mmol/L (ref 3.5–5.1)
Sodium: 127 mmol/L — ABNORMAL LOW (ref 135–145)
Total Bilirubin: 0.4 mg/dL (ref 0.3–1.2)
Total Protein: 7.3 g/dL (ref 6.5–8.1)

## 2020-06-17 LAB — RPR: RPR Ser Ql: NONREACTIVE

## 2020-06-17 LAB — GROUP B STREP BY PCR: Group B strep by PCR: NEGATIVE

## 2020-06-17 LAB — MAGNESIUM: Magnesium: 6 mg/dL — ABNORMAL HIGH (ref 1.7–2.4)

## 2020-06-17 LAB — HIV ANTIBODY (ROUTINE TESTING W REFLEX): HIV Screen 4th Generation wRfx: NONREACTIVE

## 2020-06-17 MED ORDER — DOCUSATE SODIUM 100 MG PO CAPS: 100.0000 mg | ORAL_CAPSULE | Freq: Two times a day (BID) | ORAL | Status: DC

## 2020-06-17 MED ORDER — SIMETHICONE 80 MG PO CHEW: 80.0000 mg | CHEWABLE_TABLET | ORAL | Status: DC | PRN

## 2020-06-17 MED ORDER — ONDANSETRON HCL 4 MG/2ML IJ SOLN: 4.0000 mg | INTRAMUSCULAR | Status: DC | PRN

## 2020-06-17 MED ORDER — OXYTOCIN-SODIUM CHLORIDE 30-0.9 UT/500ML-% IV SOLN
1.0000 m[IU]/min | INTRAVENOUS | Status: DC
  Administered 2020-06-17: 2 m[IU]/min via INTRAVENOUS

## 2020-06-17 MED ORDER — FAMOTIDINE 20 MG PO TABS
20.0000 mg | ORAL_TABLET | Freq: Every day | ORAL | Status: DC
  Administered 2020-06-17 – 2020-06-19 (×3): 20 mg via ORAL
  Filled 2020-06-17 (×3): qty 1

## 2020-06-17 MED ORDER — LACTATED RINGERS IV SOLN: INTRAVENOUS | Status: AC

## 2020-06-17 MED ORDER — NICOTINE POLACRILEX 2 MG MT GUM: 2.0000 mg | CHEWING_GUM | OROMUCOSAL | Status: DC | PRN

## 2020-06-17 MED ORDER — PRENATAL MULTIVITAMIN CH
1.0000 | ORAL_TABLET | Freq: Every day | ORAL | Status: DC
  Administered 2020-06-17 – 2020-06-19 (×2): 1 via ORAL
  Filled 2020-06-17 (×2): qty 1

## 2020-06-17 MED ORDER — BENZOCAINE-MENTHOL 20-0.5 % EX AERO
1.0000 "application " | INHALATION_SPRAY | CUTANEOUS | Status: DC | PRN
  Administered 2020-06-17: 1 via TOPICAL
  Filled 2020-06-17: qty 56

## 2020-06-17 MED ORDER — WITCH HAZEL-GLYCERIN EX PADS: 1.0000 "application " | MEDICATED_PAD | CUTANEOUS | Status: DC | PRN

## 2020-06-17 MED ORDER — DIPHENHYDRAMINE HCL 50 MG/ML IJ SOLN
12.5000 mg | INTRAMUSCULAR | Status: DC | PRN
  Administered 2020-06-17: 12.5 mg via INTRAVENOUS
  Filled 2020-06-17: qty 1

## 2020-06-17 MED ORDER — DIPHENHYDRAMINE HCL 12.5 MG/5ML PO ELIX: 12.5000 mg | ORAL_SOLUTION | ORAL | Status: DC | PRN

## 2020-06-17 MED ORDER — LACTATED RINGERS IV SOLN: INTRAVENOUS | Status: DC

## 2020-06-17 MED ORDER — NICOTINE 21 MG/24HR TD PT24
21.0000 mg | MEDICATED_PATCH | Freq: Every day | TRANSDERMAL | Status: DC | PRN
  Administered 2020-06-17: 21 mg via TRANSDERMAL
  Filled 2020-06-17 (×2): qty 1

## 2020-06-17 MED ORDER — ZOLPIDEM TARTRATE 5 MG PO TABS
5.0000 mg | ORAL_TABLET | Freq: Every evening | ORAL | Status: DC | PRN
  Administered 2020-06-18: 5 mg via ORAL
  Filled 2020-06-17: qty 1

## 2020-06-17 MED ORDER — IBUPROFEN 600 MG PO TABS
600.0000 mg | ORAL_TABLET | Freq: Four times a day (QID) | ORAL | Status: DC
  Administered 2020-06-17 – 2020-06-19 (×5): 600 mg via ORAL
  Filled 2020-06-17 (×6): qty 1

## 2020-06-17 MED ORDER — MAGNESIUM SULFATE 40 GM/1000ML IV SOLN
2.0000 g/h | INTRAVENOUS | Status: AC
  Administered 2020-06-17: 2 g/h via INTRAVENOUS
  Filled 2020-06-17: qty 1000

## 2020-06-17 MED ORDER — DOCUSATE SODIUM 100 MG PO CAPS: 100.0000 mg | ORAL_CAPSULE | Freq: Two times a day (BID) | ORAL | Status: DC | PRN

## 2020-06-17 MED ORDER — COCONUT OIL OIL
1.0000 "application " | TOPICAL_OIL | Status: DC | PRN
  Administered 2020-06-17: 1 via TOPICAL

## 2020-06-17 MED ORDER — NICOTINE 21 MG/24HR TD PT24: 21.0000 mg | MEDICATED_PATCH | Freq: Every day | TRANSDERMAL | Status: DC

## 2020-06-17 MED ORDER — ACETAMINOPHEN 325 MG PO TABS
650.0000 mg | ORAL_TABLET | ORAL | Status: DC | PRN
  Administered 2020-06-17 – 2020-06-19 (×4): 650 mg via ORAL
  Filled 2020-06-17 (×4): qty 2

## 2020-06-17 MED ORDER — DIBUCAINE (PERIANAL) 1 % EX OINT: 1.0000 "application " | TOPICAL_OINTMENT | CUTANEOUS | Status: DC | PRN

## 2020-06-17 MED ORDER — SENNOSIDES-DOCUSATE SODIUM 8.6-50 MG PO TABS
2.0000 | ORAL_TABLET | ORAL | Status: DC
  Administered 2020-06-17: 2 via ORAL
  Filled 2020-06-17: qty 2

## 2020-06-17 MED ORDER — LABETALOL HCL 200 MG PO TABS
200.0000 mg | ORAL_TABLET | Freq: Two times a day (BID) | ORAL | Status: DC
  Administered 2020-06-17 – 2020-06-18 (×2): 200 mg via ORAL
  Filled 2020-06-17 (×2): qty 1

## 2020-06-17 MED ORDER — MAGNESIUM SULFATE 40 GM/1000ML IV SOLN: 2.0000 g/h | INTRAVENOUS | Status: DC

## 2020-06-17 MED ORDER — DIPHENHYDRAMINE HCL 25 MG PO CAPS: 25.0000 mg | ORAL_CAPSULE | Freq: Four times a day (QID) | ORAL | Status: DC | PRN

## 2020-06-17 MED ORDER — ONDANSETRON HCL 4 MG PO TABS: 4.0000 mg | ORAL_TABLET | ORAL | Status: DC | PRN

## 2020-06-17 MED ORDER — TETANUS-DIPHTH-ACELL PERTUSSIS 5-2.5-18.5 LF-MCG/0.5 IM SUSY: 0.5000 mL | PREFILLED_SYRINGE | Freq: Once | INTRAMUSCULAR | Status: DC

## 2020-06-17 NOTE — Progress Notes (Signed)
OB Note I went to see patient re: her expired nexplanon. It's in the LUE and easily palpable. She states it has been in for 12 years. She says that they knew about it at her PN visits but didn't want to remove it during the pregnancy.  I told her I can remove it tomorrow. I asked her if she wanted another one and she said that she did.  She is very anxious to go outside and smoke. I offered her nicotine gum or patch but she does not want this. I told her that she can't leave the room unattended while she is on Mg and that it would be against medical advice. I told her I also recommend starting her on labetalol bid starting tonight to help with her BPs. Pt really wants to go outside but I told her I can't let her do that while on Mg and risks d/w her re: leaving AMA  I d/w this with nursing   Cornelia Copa MD Attending Center for [facility] (Faculty Practice) 06/17/2020 Time: [phone]

## 2020-06-17 NOTE — Progress Notes (Signed)
Labor Progress Note ALEYSHA MECKLER is a 34 y.o. O2V0350 at [redacted]w[redacted]d presented for symptoms of PEC. S: Comfortable w epidural   O:  BP (!) 150/81   Pulse 99   Temp 98 F (36.7 C) (Oral)   Resp 20   Ht 5\' 4"  (1.626 m)   Wt 76.8 kg   LMP  (LMP Unknown)   SpO2 100%   BMI 29.06 kg/m  EFM: baseline 125/mod variability/pos accels/ neg decels   CVE: Dilation: 4 Effacement (%): 80 Station: -2 Presentation: Vertex Exam by:: Dr [email or web address]   A&P: 34 y.o. G3P0203 [redacted]w[redacted]d her for IOL for PEC w SF.   #Induction of Labor: Progressing well. S/p Cytotec x2, will start pitocin now   #PEC w SF: based on HA, SR pressures. PEC labs normal. On magnesium.   --Severe range pressures following epidural placement, requiring IV labetalol. Continue to monitor.   #H/o PPH: in s/o twin delivery; required blood transfusion -low threshold for TXA at time of delivery  #Pain: epidural in place #FWB: Cat I  #GBS pending, PCN given preterm    [redacted]w[redacted]d, MD 3:31 AM

## 2020-06-17 NOTE — Anesthesia Postprocedure Evaluation (Signed)
Anesthesia Post Note  Patient: Sheena [name]  Procedure(s) Performed: AN AD HOC LABOR EPIDURAL     Patient location during evaluation: Mother Baby Anesthesia Type: Epidural Level of consciousness: awake Pain management: satisfactory to patient Vital Signs Assessment: post-procedure vital signs reviewed and stable Respiratory status: spontaneous breathing Cardiovascular status: stable Anesthetic complications: no   No complications documented.  Last Vitals:  Vitals:   06/17/20 1050 06/17/20 1219  BP:  (!) 144/96  Pulse:  91  Resp:  18  Temp:  36.7 C  SpO2: 99% 99%    Last Pain:  Vitals:   06/17/20 1219  TempSrc: Oral  PainSc:    Pain Goal: Patients Stated Pain Goal: 3 (06/17/20 1011)                 Cephus Shelling

## 2020-06-17 NOTE — Lactation Note (Signed)
This note was copied from a baby's chart. Lactation Consultation Note  Patient Name: Sheena [name] Date: 06/17/2020 Reason for consult: Initial assessment  Initial visit to P3 mother of 12 hours old preterm currently in NICU. Mother states she has breastfed twice for 3-4 months. Mother reports the intention to breastfeed infant for the same period. Mother has been proactively set up with a DEBP by RN. Mother is pumping upon arrival and expresses disappointment because only collecting drops at this point. Explained normal volume of colostrum according to gestation.   Reinforced the importance of pumping as well as basics/cleaning/frequency. Talked about breast massage, hand expression and drops easily expressed. Mother is proficient.   Reviewed Breastfeeding a NICU baby booklet and lactation services information with mother and encouraged to contact [facility] for support and questions.  Mother is not a  [facility] participant during this pregnancy. Offered to submit a referral on baby's behalf for a Hospital grade pump due to NICU admission.     Maternal Data Formula Feeding for Exclusion: Yes Reason for exclusion: Admission to Intensive Care Unit (ICU) post-partum Has patient been taught Hand Expression?: Yes Does the patient have breastfeeding experience prior to this delivery?: Yes  Feeding Feeding Type: Formula  Interventions Interventions: Breast feeding basics reviewed;DEBP;Expressed milk;Hand express;Breast massage  Lactation Tools Discussed/Used Tools: Pump Breast pump type: Double-Electric Breast Pump WIC Program: No (LC submitting referral due to infant status) Pump Review: Setup, frequency, and cleaning;Milk Storage   Consult Status Consult Status: Follow-up Date: 06/18/20 Follow-up type: In-patient    Gery Sabedra A Higuera Ancidey 06/17/2020, 7:05 PM

## 2020-06-17 NOTE — Discharge Summary (Signed)
Postpartum Discharge Summary     Patient Name: Sheena [name] DOB: 1986-03-10 MRN: [id number]  Date of admission: 06/16/2020 Delivery date:06/17/2020  Delivering provider: Janet Berlin  Date of discharge: 06/19/2020  Admitting diagnosis: Pregnancy at 35/0. GHTN with superimposed severe pre-eclampsia (BPs, HA) Additional problems: single umbilical artery. Tobacco abuse. H/o HSV2. H/o THC use. H/o pre-eclampsia    Discharge diagnosis: Preterm Pregnancy Delivered and Preeclampsia (severe)                                              Post partum procedures:none Augmentation: AROM, Pitocin, Cytotec and IP Foley Complications: None  Hospital course: Induction of Labor With Vaginal Delivery   34 y.o. yo G3P0203 at 78w1dwas admitted to the hospital 06/16/2020 for induction of labor.  Indication for induction: Preeclampsia.  Patient had an uncomplicated labor course as follows: Membrane Rupture Time/Date: 6:15 AM ,06/17/2020   Delivery Method:Vaginal, Spontaneous  Episiotomy: None  Lacerations:  None  Details of delivery can be found in separate delivery note.  Patient had a routine postpartum course: she received 24h of PP Mg and was started on procardia xl 30 bid. Her old nexplanon was removed and a new one placed. Pt seen and signed off by SW.  Newborn Data: Birth date:06/17/2020  Birth time:6:55 AM  Gender:Female  Living status:Living  Apgars:8 ,9  Weight:1930 g   Magnesium Sulfate received: Yes: Seizure prophylaxis BMZ received: Yes one dose on 11/14 Rhophylac:N/A MMR:N/A T-DaP:Given prenatally Flu: declined prenatally Transfusion:No  Physical exam  Vitals:   06/18/20 2245 06/19/20 0906 06/19/20 1234 06/19/20 1524  BP:  (!) 156/79 (!) 156/72 (!) 144/72  Pulse: 79 61 75 91  Resp: 18 18 18 18   Temp: 98.3 F (36.8 C) 98.3 F (36.8 C) 98 F (36.7 C) 98.3 F (36.8 C)  TempSrc: Oral Oral Oral Oral  SpO2: 98% 98% 98% 97%  Weight:      Height:       General:  alert Lochia: minimal Uterine Fundus: firm, nttp Incision: N/A DVT Evaluation: No evidence of DVT seen on physical exam. Labs: Lab Results  Component Value Date   WBC 30.0 (H) 06/17/2020   HGB 12.4 06/17/2020   HCT 35.9 (L) 06/17/2020   MCV 89.1 06/17/2020   PLT 397 06/17/2020   CMP Latest Ref Rng & Units 06/17/2020  Glucose 70 - 99 mg/dL 119(H)  BUN 6 - 20 mg/dL 5(L)  Creatinine 0.44 - 1.00 mg/dL 0.78  Sodium 135 - 145 mmol/L 127(L)  Potassium 3.5 - 5.1 mmol/L 4.4  Chloride 98 - 111 mmol/L 94(L)  CO2 22 - 32 mmol/L 18(L)  Calcium 8.9 - 10.3 mg/dL 7.6(L)  Total Protein 6.5 - 8.1 g/dL 7.3  Total Bilirubin 0.3 - 1.2 mg/dL 0.4  Alkaline Phos 38 - 126 U/L 212(H)  AST 15 - 41 U/L 24  ALT 0 - 44 U/L 15   Edinburgh Score: Edinburgh Postnatal Depression Scale Screening Tool 06/17/2020  I have been able to laugh and see the funny side of things. 0  I have looked forward with enjoyment to things. 2  I have blamed myself unnecessarily when things went wrong. 2  I have been anxious or worried for no good reason. 3  I have felt scared or panicky for no good reason. 3  Things have been getting on top of me.  1  I have been so unhappy that I have had difficulty sleeping. 3  I have felt sad or miserable. 2  I have been so unhappy that I have been crying. 1  The thought of harming myself has occurred to me. 0  Edinburgh Postnatal Depression Scale Total 17     After visit meds:  Allergies as of 06/19/2020   No Known Allergies     Medication List    STOP taking these medications   aspirin EC 81 MG tablet     TAKE these medications   acetaminophen 500 MG tablet Commonly known as: TYLENOL Take 500 mg by mouth every 6 (six) hours as needed.   Blood Pressure Monitor Misc For regular home bp monitoring during pregnancy   docusate sodium 100 MG capsule Commonly known as: COLACE Take 1 capsule (100 mg total) by mouth 2 (two) times daily as needed for mild constipation.    guaiFENesin 600 MG 12 hr tablet Commonly known as: MUCINEX Take by mouth 2 (two) times daily.   ibuprofen 600 MG tablet Commonly known as: ADVIL Take 1 tablet (600 mg total) by mouth every 6 (six) hours as needed.   NIFEdipine 30 MG 24 hr tablet Commonly known as: ADALAT CC Take 1 tablet (30 mg total) by mouth 2 (two) times daily.   omeprazole 20 MG capsule Commonly known as: PRILOSEC Take 20 mg by mouth in the morning.   prenatal multivitamin Tabs tablet Take 1 tablet by mouth daily at 12 noon.        Discharge home in stable condition Infant Feeding: formula and Breast Infant Disposition:home with mother Discharge instruction: per After Visit Summary and Postpartum booklet. Activity: Advance as tolerated. Pelvic rest for 6 weeks.  Diet: routine diet Future Appointments: Future Appointments  Date Time Provider [place]  06/24/2020 10:30 AM [facility] NURSE [facility]  07/22/2020  1:30 PM Cresenzo-Dishmon, Joaquim Lai, CNM [facility]   Follow up Visit:  Follow-up Information    [facility] Follow up in 5 day(s).   Specialty: Obstetrics and Gynecology Why: blood pressure check Contact information: [place] [phone]               Please schedule this patient for a In person postpartum visit in 4 weeks with the following provider: MD. Additional Postpartum F/U:BP check 1 week  High risk pregnancy complicated by: PEC w SF, tobacco and mj use  Delivery mode:  Vaginal, Spontaneous  Anticipated Birth Control:  Nexplanon placed prior to d/c.   06/19/2020 Aletha Halim, MD

## 2020-06-18 ENCOUNTER — Other Ambulatory Visit: Payer: Medicaid Other

## 2020-06-18 ENCOUNTER — Encounter (HOSPITAL_COMMUNITY): Payer: Self-pay | Admitting: Obstetrics & Gynecology

## 2020-06-18 ENCOUNTER — Encounter: Payer: Medicaid Other | Admitting: Obstetrics & Gynecology

## 2020-06-18 DIAGNOSIS — Z30017 Encounter for initial prescription of implantable subdermal contraceptive: Secondary | ICD-10-CM

## 2020-06-18 HISTORY — PX: INSERTION OF IMPLANON ROD: OBO 1005

## 2020-06-18 HISTORY — PX: REMOVAL OF IMPLANON ROD: OBO 1006

## 2020-06-18 MED ORDER — LABETALOL HCL 100 MG PO TABS
100.0000 mg | ORAL_TABLET | Freq: Two times a day (BID) | ORAL | Status: DC
  Administered 2020-06-18: 100 mg via ORAL
  Filled 2020-06-18: qty 1

## 2020-06-18 MED ORDER — LIDOCAINE-EPINEPHRINE 1 %-1:100000 IJ SOLN
3.0000 mL | Freq: Once | INTRAMUSCULAR | Status: DC
  Filled 2020-06-18 (×2): qty 3

## 2020-06-18 MED ORDER — LIDOCAINE HCL 1 % IJ SOLN
0.0000 mL | Freq: Once | INTRAMUSCULAR | Status: AC | PRN
  Administered 2020-06-18: 20 mL via INTRADERMAL
  Filled 2020-06-18: qty 20

## 2020-06-18 MED ORDER — ETONOGESTREL 68 MG ~~LOC~~ IMPL
68.0000 mg | DRUG_IMPLANT | Freq: Once | SUBCUTANEOUS | Status: AC
  Administered 2020-06-18: 68 mg via SUBCUTANEOUS
  Filled 2020-06-18 (×2): qty 1

## 2020-06-18 NOTE — Procedures (Signed)
Nexplanon Removal and Insertion Procedure Note Prior to the procedure being performed, the patient was asked to state their full name, date of birth, type of procedure being performed and the exact location of the operative site. This information was then checked against the documentation in the patient's chart. Prior to the procedure being performed, a "time out" was performed by the physician that confirmed the correct patient, procedure and site.  After informed consent was obtained, the patient's left arm was examined and the Nexplanon rod was noted to be easily palpable approximately 8 cm proximal to the medial epicondyle in the sulcus between the biceps and triceps on the inner surface. The area was cleaned with alcohol then local anesthesia was infiltrated with 3 ml of 1% lidocaine with epinephrine. The area was prepped with betadine. Using sterile technique, the old incision was used and opened with an 11 blade scalpel; the Nexplanon device was brought to the incision site. The capsule was scrapped off with the scalpel, the Nexplanon grasped with hemostats, and easily removed; the removal site was hemostatic. The Nexplanon was inspected and noted to be intact.  The area was cleaned again with alcohol then local anesthesia was infiltrated with 3 ml of 1% lidocaine along the planned insertion track. The area was prepped with betadine. Using sterile technique the Nexplanon device was inserted per manufacturer's guidelines in the subdermal connective tissue using the standard insertion technique without difficulty. Pressure was applied and the insertion site was hemostatic. The presence of the Nexplanon was confirmed immediately after insertion by palpation by both me and the patient and by checking the tip of needle for the absence of the insert.  A pressure dressing was applied.  Lot #: [id number] Exp: [id number]  The patient tolerated the procedure well.  Cornelia Copa MD Attending Center for  [employer] (Faculty Practice)    Sheena [name], Sheena Hageman MD Attending Center for [facility])

## 2020-06-18 NOTE — Lactation Note (Signed)
This note was copied from a baby's chart. Lactation Consultation Note  Patient Name: Sheena [name] [id number] Date: 06/18/2020 Reason for consult: Follow-up assessment;NICU baby  LC to mother's room for f/u visit. She initiated pumping yesterday and prefers the hand pump. Reviewed importance of breast stimulation to bring in milk and avoid engorgement. Patient was provided with the opportunity to ask questions. All concerns were addressed.  Will plan follow up visit.   Interventions Interventions: Breast feeding basics reviewed;Expressed milk;Hand express;Breast compression;DEBP   Consult Status Consult Status: Follow-up Date: 06/19/20 Follow-up type: In-patient    [name] 06/18/2020, 8:57 AM

## 2020-06-18 NOTE — Progress Notes (Signed)
Daily Postpartum Note  Admission Date: 06/16/2020 Current Date: 06/18/2020 3:49 PM  Sheena [name] is a 34 y.o. [id number] PPD#1 SVD/intact perineum @ [redacted]w[redacted]d, patient admitted for IOL for severe pre-eclampsia (HA with gHTN).  Pregnancy complicated by: Patient Active Problem List   Diagnosis Date Noted  . Severe preeclampsia, third trimester 06/07/2020  . Cervical high risk human papillomavirus (HPV) DNA test positive 03/20/2020  . 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 Supervision of high risk pregnancy, antepartum 03/06/2020  . Late prenatal care in second trimester 03/06/2020  . History of pre-eclampsia in prior pregnancy, currently pregnant 03/06/2020  . Marijuana use 03/06/2020  . 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 Smoker 03/06/2020  . Alcohol use 03/06/2020  . History of postpartum hemorrhage 03/06/2020    Overnight/24hr events:  Came off Mg this morning  Subjective:  No s/s of pre-eclampsia. Meeting all PP goals.   Objective:    Current Vital Signs 24h Vital Sign Ranges  T 98.2 F (36.8 C) Temp  Avg: 97.7 F (36.5 C)  Min: 97 F (36.1 C)  Max: 98.2 F (36.8 C)  BP 132/86 BP  Min: 115/59  Max: 154/92  HR 88 Pulse  Avg: 84.1  Min: 75  Max: 92  RR 18 Resp  Avg: 18.1  Min: 16  Max: 20  SaO2 99 %  (Room Air) SpO2  Avg: 98.8 %  Min: 98 %  Max: 100 %       24 Hour I/O Current Shift I/O  Time Ins Outs 11/15 0701 - 11/16 0700 In: 5451 [P.O.:2394; I.V.:3057] Out: 4400 [Urine:4400] 11/16 0701 - 11/16 1900 In: -  Out: 1700 [Urine:1700]   Patient Vitals for the past 24 hrs:  BP Temp Temp src Pulse Resp SpO2  06/18/20 1512 132/86 98.2 F (36.8 C) Oral 88 18 99 %  06/18/20 0750 117/66 97.9 F (36.6 C) Oral 77 18 100 %  06/18/20 0700 -- -- -- -- 17 --  06/18/20 0600 -- -- -- -- 16 --  06/18/20 0342 (!) 115/59 97.7 F (36.5 C) Oral 91 20 98 %  06/18/20 0300 -- -- -- -- 19 --  06/18/20 0115 -- -- -- -- 18 --  06/18/20 0100 --  -- -- -- 18 --  06/18/20 0000 -- -- -- -- 18 --  06/17/20 2336 127/78 -- -- 87 -- --  06/17/20 2316 127/78 97.8 F (36.6 C) Oral 92 18 99 %  06/17/20 2300 -- -- -- -- 18 --  06/17/20 2200 -- -- -- -- 18 --  06/17/20 2100 -- -- -- -- 18 --  06/17/20 2010 (!) 154/92 (!) 97 F (36.1 C) Axillary 79 18 --  06/17/20 2000 -- -- -- -- 18 --  06/17/20 1814 (!) 148/95 97.8 F (36.6 C) Oral 75 19 98 %    Physical exam: General: Well nourished, well developed female in no acute distress. Abdomen: soft nttp, firm fundus below the umbilicus Cardiovascular: S1, S2 normal, no murmur, rub or gallop, regular rate and rhythm Respiratory: CTAB Extremities: no clubbing, cyanosis or edema Skin: Warm and dry.   See procedure note nexplanon removal and insertion in the LUE  Medications: Current Facility-Administered Medications  Medication Dose Route Frequency Provider Last Rate Last Admin  . acetaminophen (TYLENOL) tablet 650 mg  650 mg Oral Q4H PRN Gita Kudo, MD   650 mg at 06/17/20 1851  . benzocaine-Menthol (DERMOPLAST) 20-0.5 % topical spray 1 application  1  application Topical PRN Gita Kudo, MD   1 application at 06/17/20 1851  . coconut oil  1 application Topical PRN Gita Kudo, MD   1 application at 06/17/20 1851  . witch hazel-glycerin (TUCKS) pad 1 application  1 application Topical PRN Marsala, Arlana Pouch, MD       And  . dibucaine (NUPERCAINAL) 1 % rectal ointment 1 application  1 application Rectal PRN Gita Kudo, MD      . diphenhydrAMINE (BENADRYL) capsule 25 mg  25 mg Oral Q6H PRN Gita Kudo, MD      . docusate sodium (COLACE) capsule 100 mg  100 mg Oral BID PRN Newaygo Bing, MD      . famotidine (PEPCID) tablet 20 mg  20 mg Oral Daily [place] Bing, MD   20 mg at 06/18/20 1002  . labetalol (NORMODYNE) injection 20 mg  20 mg Intravenous PRN Gita Kudo, MD   20 mg at 06/17/20 0747   And  . labetalol (NORMODYNE) injection 40 mg  40 mg Intravenous  PRN Gita Kudo, MD   40 mg at 06/17/20 1038   And  . labetalol (NORMODYNE) injection 80 mg  80 mg Intravenous PRN Gita Kudo, MD       And  . hydrALAZINE (APRESOLINE) injection 10 mg  10 mg Intravenous PRN Gita Kudo, MD      . ibuprofen (ADVIL) tablet 600 mg  600 mg Oral Q6H Gita Kudo, MD   600 mg at 06/18/20 0528  . labetalol (NORMODYNE) tablet 100 mg  100 mg Oral BID [place] Bing, MD      . lidocaine-EPINEPHrine (XYLOCAINE W/EPI) 1 %-1:100000 (with pres) injection 3 mL  3 mL Intradermal Once Tabor Bing, MD      . nicotine (NICODERM CQ - dosed in mg/24 hours) patch 21 mg  21 mg Transdermal Daily PRN [place] Bing, MD   21 mg at 06/17/20 1826  . ondansetron (ZOFRAN) tablet 4 mg  4 mg Oral Q4H PRN Gita Kudo, MD       Or  . ondansetron [facility]) injection 4 mg  4 mg Intravenous Q4H PRN Gita Kudo, MD      . prenatal multivitamin tablet 1 tablet  1 tablet Oral Q1200 Gita Kudo, MD   1 tablet at 06/17/20 1157  . senna-docusate (Senokot-S) tablet 2 tablet  2 tablet Oral Q24H Gita Kudo, MD   2 tablet at 06/17/20 2354  . simethicone (MYLICON) chewable tablet 80 mg  80 mg Oral PRN Gita Kudo, MD      . Tdap Leda Min) injection 0.5 mL  0.5 mL Intramuscular Once Gita Kudo, MD      . zolpidem [facility]) tablet 5 mg  5 mg Oral QHS PRN Hermina Staggers, MD   5 mg at 06/18/20 0101    Labs:  Recent Labs  Lab 06/16/20 1202 06/16/20 2146 06/17/20 0741  WBC 18.4* 21.3* 30.0*  HGB 13.2 12.5 12.4  HCT 37.5 35.6* 35.9*  PLT 397 391 397    Recent Labs  Lab 06/16/20 1202 06/17/20 0605  NA 134* 127*  K 3.7 4.4  CL 104 94*  CO2 18* 18*  BUN 5* 5*  CREATININE 0.72 0.78  CALCIUM 9.0 7.6*  PROT 7.0 7.3  BILITOT 0.4 0.4  ALKPHOS 212* 212*  ALT 16 15  AST 26 24  GLUCOSE 101* 119*     Radiology:  No new imaging  Assessment & Plan:  Pt doing well *PP: routine care. Nexplanon inserted. pumping *Severe pre-eclampsia:  continue with labetalol *SW: consult pending *PPx: OOB ad lib *FEN/GI: regular diet, sliv *Dispo: likely tomorrow.   Cornelia Copa MD Attending [facility])

## 2020-06-18 NOTE — Progress Notes (Signed)
CSW made a [place] [employer] CPS report due to infant's positive UDS for THC.   Quintessa Simmerman, LCSW Clinical Social Worker [facility] Cell#: [phone]  

## 2020-06-18 NOTE — Clinical Social Work Maternal (Signed)
°CLINICAL SOCIAL WORK MATERNAL/CHILD NOTE ° °Patient Details  °Name: Sheena [name] °MRN: [id number] °Date of Birth: 09/19/1985 ° °Date:  06/18/2020 ° °Clinical Social Worker Initiating Note:  Melaya Hoselton, LCSW Date/Time: Initiated:  06/18/20/1510    ° °Child's Name:  Sheena [name]  ° °Biological Parents:  Mother, Father (Father: Joshua [name])  ° °Need for Interpreter:  None  ° °Reason for Referral:  Current Substance Use/Substance Use During Pregnancy  , Behavioral Health Concerns, Other (Comment) (Infant's NICU admission)  ° °Address:  [address] °[place] [address]  °  °Phone number:  [phone] (home)    ° °Additional phone number:  ° °Household Members/Support Persons (HM/SP):   Household Member/Support Person 1, Household Member/Support Person 2, Household Member/Support Person 3, Household Member/Support Person 4 ° ° °HM/SP Name Relationship DOB or Age  °HM/SP -1 Joshua [name] FOB 06/20/1986  °HM/SP -2 Mallory Carty daughter 08/18/2005  °HM/SP -3 Justice Tickle son 09/18/2008  °HM/SP -4 Jacob Tickle son 09/18/2008  °HM/SP -5        °HM/SP -6        °HM/SP -7        °HM/SP -8        ° ° °Natural Supports (not living in the home):  Immediate Family, Extended Family  ° °Professional Supports: None  ° °Employment: Unemployed  ° °Type of Work:    ° °Education:  Other (comment) (GED)  ° °Homebound arranged:   ° °Financial Resources:  Medicaid  ° °Other Resources:  Food Stamps   (Plans to apply for WIC)  ° °Cultural/Religious Considerations Which May Impact Care:   ° °Strengths:  Ability to meet basic needs  , Pediatrician chosen, Home prepared for child  , Understanding of illness  ° °Psychotropic Medications:        ° °Pediatrician:    [place] area ° °Pediatrician List:  ° °[place]    °[facility] ([address])  °[place]    °[place]    °[place]    °[place]    ° ° °Pediatrician Fax Number:   ° °Risk Factors/Current Problems:  Substance  Use  , Mental Health Concerns    ° °Cognitive State:  Able to Concentrate  , Alert  , Goal Oriented  , Insightful  , Linear Thinking    ° °Mood/Affect:  Calm  , Interested  , Comfortable    ° °CSW Assessment: CSW met with MOB at bedside to discuss consult for substance use during pregnancy, behavioral health concerns (edinburgh score 17) and infant's NICU admission, FOB present. CSW introduced self and asked to speak with MOB privately, FOB left the room. CSW explained reason for visit. MOB was welcoming, open, pleasant and remained engaged during assessment. MOB reported that she resides with FOB and three older children. MOB reported that she receives food stamps and called WIC earlier today. MOB reported that she plans to follow up with WIC. MOB reported that she doesn't have a breast pump yet. CSW asked if MOB needed a manual pump to use until she is able to obtain an electric pump from WIC. MOB reported yes. CSW provided MOB with a manual breast pump. MOB reported that she has all items needed to care for infant including a car seat and basinet. MOB shared pictures of infant's room. CSW inquired about MOB's support system, MOB reported that her sister, the kids grandparents and FOB's family are supports. MOB reported that her sister is currently at home with her children.  ° °CSW inquired about MOB's mental health history. MOB   reported that she was diagnosed with depression years ago and denied any current symptoms. MOB reported that she has anxiety and is not currently taking any medication or participating in therapy to treat anxiety. MOB shared that anxiety runs in her family. MOB described her anxiety as over thinking and having issues with sleeping. CSW inquired about MOB's coping skills, MOB reported that her kids and pets help her to cope. MOB shared that her bird died yesterday, CSW offered condolences. MOB reported that cigarettes are also a coping skill for her, noting it helps with stress. CSW  inquired about MOB's interest in cessation of smoking, MOB reported that she should stop but she is unable to at this moment. CSW informed MOB to notify CSW if and when she needs resources to assist with the cessation of smoking. MOB and CSW discussed edinburgh score 17. MOB reported that her anxiety of the unknown regarding baby's health attributed to the high score. MOB reported that she didn't know that she was pregnant until she was 4 months along which caused additional anxiety and worry about infant being okay. CSW inquired about how MOB was feeling emotionally after giving birth, MOB reported that she was feeling so happy, grateful and thankful. MOB shared that this was her easiest birth. MOB presented calm and did not demonstrate any acute mental health signs/symptoms. CSW assessed for safety, MOB denied SI, HI and domestic violence.  ° °CSW provided education regarding the baby blues period vs. perinatal mood disorders, discussed treatment and gave resources for mental health follow up if concerns arise.  CSW recommends self-evaluation during the postpartum time period using the New Mom Checklist from Postpartum Progress and encouraged MOB to contact a medical professional if symptoms are noted at any time.   ° °CSW provided review of Sudden Infant Death Syndrome (SIDS) precautions.   ° °CSW and MOB discussed infant's NICU admission. CSW informed MOB about the NICU, what to expect and resources/supports available while infant is admitted to the NICU. MOB reported that she feels well informed about infant's care and denied any transportation barriers with visiting infant in the NICU. MOB spoke about the importance of being updated and involved in infant's care. CSW acknowledged and validated MOB's feelings associated with being updated and involved in infant's care. CSW spoke with MOB about the importance of self care while having an infant in the NICU, MOB verbalized understanding. MOB reported that meal  vouchers would be helpful, CSW provided 6 meal vouchers. MOB denied any additional needs/concerns regarding the NICU.  ° °CSW informed MOB about the hospital drug screen policy due to substance use during pregnancy. MOB confirmed that she smoked marijuana during pregnancy. MOB reported that her last use was before coming to the hospital. MOB reported that she smoked marijuana due to nausea and not having an appetite. MOB denied any other substance use during pregnancy. MOB shared that she had not smoked marijuana in 15 years prior to getting pregnant. CSW informed MOB that infant's UDS was positive for THC and a CPS report would be made. MOB became tearful and shared that she didn't have a good experience with CPS in the past. MOB reported that she had a [place] CPS case 2-3 years ago that is now closed. MOB denied any questions/concerns regarding the report. CSW provided emotional support and encouraging words.  ° °CSW will make a [place] CPS report due to infant's positive UDS for THC.  ° °CSW will continue to offer resources/supports while infant   is admitted to the NICU.  ° ° °CSW Plan/Description:  Sudden Infant Death Syndrome (SIDS) Education, Perinatal Mood and Anxiety Disorder (PMADs) Education, Psychosocial Support and Ongoing Assessment of Needs, Hospital Drug Screen Policy Information, Child Protective Service Report  , CSW Awaiting CPS Disposition Plan, Other Patient/Family Education  ° ° °Yuval Nolet L Sherilynn Dieu, LCSW °06/18/2020, 3:14 PM °

## 2020-06-19 ENCOUNTER — Other Ambulatory Visit (HOSPITAL_COMMUNITY): Payer: Self-pay | Admitting: Obstetrics and Gynecology

## 2020-06-19 MED ORDER — IBUPROFEN 600 MG PO TABS
600.0000 mg | ORAL_TABLET | Freq: Four times a day (QID) | ORAL | 0 refills | Status: DC | PRN
Start: 2020-06-19 — End: 2020-06-19

## 2020-06-19 MED ORDER — DOCUSATE SODIUM 100 MG PO CAPS: 100.0000 mg | ORAL_CAPSULE | Freq: Two times a day (BID) | ORAL | 1 refills | Status: DC | PRN

## 2020-06-19 MED ORDER — NIFEDIPINE ER 30 MG PO TB24
30.0000 mg | ORAL_TABLET | Freq: Two times a day (BID) | ORAL | 1 refills | Status: DC
Start: 2020-06-19 — End: 2020-06-19

## 2020-06-19 MED ORDER — NIFEDIPINE ER 30 MG PO TB24: 30.0000 mg | ORAL_TABLET | Freq: Two times a day (BID) | ORAL | 1 refills | Status: DC

## 2020-06-19 MED ORDER — NIFEDIPINE ER OSMOTIC RELEASE 30 MG PO TB24
30.0000 mg | ORAL_TABLET | Freq: Two times a day (BID) | ORAL | Status: DC
  Administered 2020-06-19: 30 mg via ORAL
  Filled 2020-06-19: qty 1

## 2020-06-19 MED FILL — NIFEdipine ER 30 MG TB24: 30 | 30 days supply | Qty: 60 | Fill #0

## 2020-06-19 MED FILL — IBUPROFEN 600 MG TABLET: 600 | 7 days supply | Qty: 30 | Fill #0

## 2020-06-19 MED FILL — DOCUSATE SODIUM 100 MG CAPS: 100 | 15 days supply | Qty: 30 | Fill #0

## 2020-06-19 NOTE — Discharge Instructions (Signed)
Hypertension During Pregnancy Hypertension is also called high blood pressure. High blood pressure means that the force of your blood moving in your body is too strong. It can cause problems for you and your baby. Different types of high blood pressure can happen during pregnancy. The types are:  High blood pressure before you got pregnant. This is called chronic hypertension.  This can continue during your pregnancy. Your doctor will want to keep checking your blood pressure. You may need medicine to keep your blood pressure under control while you are pregnant. You will need follow-up visits after you have your baby.  High blood pressure that goes up during pregnancy when it was normal before. This is called gestational hypertension. It will usually get better after you have your baby, but your doctor will need to watch your blood pressure to make sure that it is getting better.  Very high blood pressure during pregnancy. This is called preeclampsia. Very high blood pressure is an emergency that needs to be checked and treated right away.  You may develop very high blood pressure after giving birth. This is called postpartum preeclampsia. This usually occurs within 48 hours after childbirth but may occur up to 6 weeks after giving birth. This is rare. How does this affect me? If you have high blood pressure during pregnancy, you have a higher chance of developing high blood pressure:  As you get older.  If you get pregnant again. In some cases, high blood pressure during pregnancy can cause:  Stroke.  Heart attack.  Damage to the kidneys, lungs, or liver.  Preeclampsia.  Jerky movements you cannot control (convulsions or seizures).  Problems with the placenta.   What can I do to lower my risk?   Keep a healthy weight.  Eat a healthy diet.  Follow what your doctor tells you about treating any medical problems that you had before becoming pregnant. It is very important to go to  all of your doctor visits. Your doctor will check your blood pressure and make sure that your pregnancy is progressing as it should. Treatment should start early if a problem is found.   Follow these instructions at home:  Take your blood pressure 1-2 times per day. Call the office if your blood pressure is 155 or higher for the top number or 105 or higher for the bottom number.    Eating and drinking   Drink enough fluid to keep your pee (urine) pale yellow.  Avoid caffeine. Lifestyle  Do not use any products that contain nicotine or tobacco, such as cigarettes, e-cigarettes, and chewing tobacco. If you need help quitting, ask your doctor.  Do not use alcohol or drugs.  Avoid stress.  Rest and get plenty of sleep.  Regular exercise can help. Ask your doctor what kinds of exercise are best for you. General instructions  Take over-the-counter and prescription medicines only as told by your doctor.  Keep all prenatal and follow-up visits as told by your doctor. This is important. Contact a doctor if:  You have symptoms that your doctor told you to watch for, such as: ? Headaches. ? Nausea. ? Vomiting. ? Belly (abdominal) pain. ? Dizziness. ? Light-headedness. Get help right away if:  You have: ? Very bad belly pain that does not get better with treatment. ? A very bad headache that does not get better. ? Vomiting that does not get better. ? Sudden, fast weight gain. ? Sudden swelling in your hands, ankles, or face. ?   Blood in your pee. ? Blurry vision. ? Double vision. ? Shortness of breath. ? Chest pain. ? Weakness on one side of your body. ? Trouble talking. Summary  High blood pressure is also called hypertension.  High blood pressure means that the force of your blood moving in your body is too strong.  High blood pressure can cause problems for you and your baby.  Keep all follow-up visits as told by your doctor. This is important. This information is  not intended to replace advice given to you by your health care provider. Make sure you discuss any questions you have with your health care provider. Document Released: 08/22/2010 Document Revised: 11/10/2018 Document Reviewed: 08/16/2018 Elsevier Patient Education  2020 Elsevier Inc.    Vaginal Delivery, Care After Refer to this sheet in the next few weeks. These discharge instructions provide you with information on caring for yourself after delivery. Your caregiver may also give you specific instructions. Your treatment has been planned according to the most current medical practices available, but problems sometimes occur. Call your caregiver if you have any problems or questions after you go home. HOME CARE INSTRUCTIONS 1. Take over-the-counter or prescription medicines only as directed by your caregiver or pharmacist. 2. Do not drink alcohol, especially if you are breastfeeding or taking medicine to relieve pain. 3. Do not smoke tobacco. 4. Continue to use good perineal care. Good perineal care includes: 1. Wiping your perineum from back to front 2. Keeping your perineum clean. 3. You can do sitz baths twice a day, to help keep this area clean 5. Do not use tampons, douche or have sex until your caregiver says it is okay. 6. Shower only and avoid sitting in submerged water, aside from sitz baths 7. Wear a well-fitting bra that provides breast support. 8. Eat healthy foods. 9. Drink enough fluids to keep your urine clear or pale yellow. 10. Eat high-fiber foods such as whole grain cereals and breads, brown rice, beans, and fresh fruits and vegetables every day. These foods may help prevent or relieve constipation. 11. Avoid constipation with high fiber foods or medications, such as miralax or metamucil 12. Follow your caregiver's recommendations regarding resumption of activities such as climbing stairs, driving, lifting, exercising, or traveling. 13. Talk to your caregiver about  resuming sexual activities. Resumption of sexual activities is dependent upon your risk of infection, your rate of healing, and your comfort and desire to resume sexual activity. 14. Try to have someone help you with your household activities and your newborn for at least a few days after you leave the hospital. 15. Rest as much as possible. Try to rest or take a nap when your newborn is sleeping. 16. Increase your activities gradually. 17. Keep all of your scheduled postpartum appointments. It is very important to keep your scheduled follow-up appointments. At these appointments, your caregiver will be checking to make sure that you are healing physically and emotionally. SEEK MEDICAL CARE IF:   You are passing large clots from your vagina. Save any clots to show your caregiver.  You have a foul smelling discharge from your vagina.  You have trouble urinating.  You are urinating frequently.  You have pain when you urinate.  You have a change in your bowel movements.  You have increasing redness, pain, or swelling near your vaginal incision (episiotomy) or vaginal tear.  You have pus draining from your episiotomy or vaginal tear.  Your episiotomy or vaginal tear is separating.  You have painful,   hard, or reddened breasts.  You have a severe headache.  You have blurred vision or see spots.  You feel sad or depressed.  You have thoughts of hurting yourself or your newborn.  You have questions about your care, the care of your newborn, or medicines.  You are dizzy or light-headed.  You have a rash.  You have nausea or vomiting.  You were breastfeeding and have not had a menstrual period within 12 weeks after you stopped breastfeeding.  You are not breastfeeding and have not had a menstrual period by the 12th week after delivery.  You have a fever. SEEK IMMEDIATE MEDICAL CARE IF:   You have persistent pain.  You have chest pain.  You have shortness of breath.  You  faint.  You have leg pain.  You have stomach pain.  Your vaginal bleeding saturates two or more sanitary pads in 1 hour. MAKE SURE YOU:   Understand these instructions.  Will watch your condition.  Will get help right away if you are not doing well or get worse. Document Released: 07/17/2000 Document Revised: 12/04/2013 Document Reviewed: 03/16/2012 ExitCare Patient Information 2015 [employer]. This information is not intended to replace advice given to you by your health care provider. Make sure you discuss any questions you have with your health care provider.  Sitz Bath A sitz bath is a warm water bath taken in the sitting position. The water covers only the hips and butt (buttocks). We recommend using one that fits in the toilet, to help with ease of use and cleanliness. It may be used for either healing or cleaning purposes. Sitz baths are also used to relieve pain, itching, or muscle tightening (spasms). The water may contain medicine. Moist heat will help you heal and relax.  HOME CARE  Take 3 to 4 sitz baths a day. 18. Fill the bathtub half-full with warm water. 19. Sit in the water and open the drain a little. 20. Turn on the warm water to keep the tub half-full. Keep the water running constantly. 21. Soak in the water for 15 to 20 minutes. 22. After the sitz bath, pat the affected area dry. GET HELP RIGHT AWAY IF: You get worse instead of better. Stop the sitz baths if you get worse. MAKE SURE YOU:  Understand these instructions.  Will watch your condition.  Will get help right away if you are not doing well or get worse. Document Released: 08/27/2004 Document Revised: 04/13/2012 Document Reviewed: 11/17/2010 ExitCare Patient Information 2015 [employer]. This information is not intended to replace advice given to you by your health care provider. Make sure you discuss any questions you have with your health care provider.    

## 2020-06-19 NOTE — Lactation Note (Addendum)
This note was copied from a baby's chart. Lactation Consultation Note  Patient Name: Sheena [name] [id number] Date: 06/19/2020 Reason for consult: Follow-up assessment;NICU baby;Infant < 6lbs;Late-preterm 34-36.6wks IUGR  LC in to visit with P4 Mom of 89 hr old infant in the NICU.  AGA [redacted]w[redacted]d and baby's weight is <4 lbs.   SLP in to work with baby.  Baby removed from isolette and placed STS under Mom's shirt.  Baby not showing consistent feeding cues at this time. Baby briefly awake and then fell asleep.  Mom states baby latched and suckled on her nipple earlier today.  Mom encouraged to hand express a drop onto her nipple to entice baby when he is showing cues.   Mom hasn't been consistently double pumping as she is preferring the hand pump over the DEBP.  Talked with Mom about importance of stimulating her milk producing hormones to support a full milk supply.  Mom seems ready to start using the DEBP.  LC went down to her OBSC room and collected the pump parts for Mom to pump after doing STS with baby.    Set up DEBP and instructed Mom on importance of disassembling pump parts, washing, rinsing and air drying parts in separate bin provided.  Mom does not have a DEBP at home.  WIC referral was sent 2 days ago and [facility] has called, but Mom needs to call them back to get certified and a pump for home use.  Mom states she will be staying in baby's NICU room for now.    Talked about importance of double pumping every 2-3 hrs during the day and 3-4 hrs at night.  Mom has a hand pump for home use if she doesn't get a DEBP from [facility].  Interventions Interventions: Breast feeding basics reviewed;Skin to skin;Breast massage;Hand express;DEBP;Hand pump  Lactation Tools Discussed/Used Tools: Pump Breast pump type: Manual;Double-Electric Breast Pump   Consult Status Consult Status: Follow-up Date: 06/20/20 Follow-up type: In-patient    [name] 06/19/2020, 11:48 AM

## 2020-06-19 NOTE — Progress Notes (Signed)
Daily Postpartum Note  Admission Date: 06/16/2020 Current Date: 06/19/2020 1:31 PM  Sheena [name] is a 34 y.o. [id number] PPD#2 SVD/intact perineum @ [redacted]w[redacted]d, patient admitted for IOL for severe pre-eclampsia (HA with gHTN).  Pregnancy complicated by: Patient Active Problem List   Diagnosis Date Noted  . Severe preeclampsia, third trimester 06/07/2020  . Cervical high risk human papillomavirus (HPV) DNA test positive 03/20/2020  . 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 Supervision of high risk pregnancy, antepartum 03/06/2020  . Late prenatal care in second trimester 03/06/2020  . History of pre-eclampsia in prior pregnancy, currently pregnant 03/06/2020  . Marijuana use 03/06/2020  . 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 Smoker 03/06/2020  . Alcohol use 03/06/2020  . History of postpartum hemorrhage 03/06/2020    Overnight/24hr events:  Patient switched to procardia from labetalol. First dose this morning  Subjective:  No s/s of pre-eclampsia. Meeting all PP goals.   Objective:    Current Vital Signs 24h Vital Sign Ranges  T 98 F (36.7 C) Temp  Avg: 98.2 F (36.8 C)  Min: 98 F (36.7 C)  Max: 98.3 F (36.8 C)  BP (!) 156/72 BP  Min: 132/86  Max: 156/72  HR 75 Pulse  Avg: 75.8  Min: 61  Max: 88  RR 18 Resp  Avg: 18  Min: 18  Max: 18  SaO2 98 %  (Room Air) SpO2  Avg: 98.3 %  Min: 98 %  Max: 99 %       24 Hour I/O Current Shift I/O  Time Ins Outs 11/16 0701 - 11/17 0700 In: -  Out: 1700 [Urine:1700] No intake/output data recorded.   Patient Vitals for the past 24 hrs:  BP Temp Temp src Pulse Resp SpO2  06/19/20 1234 (!) 156/72 98 F (36.7 C) Oral 75 18 98 %  06/19/20 0906 (!) 156/79 98.3 F (36.8 C) Oral 61 18 98 %  06/18/20 2245 (!) 146/88 98.3 F (36.8 C) Oral 79 18 98 %  06/18/20 1512 132/86 98.2 F (36.8 C) Oral 88 18 99 %    Physical exam: General: Well nourished, well developed female in no acute  distress. Abdomen: soft nttp, firm fundus below the umbilicus Cardiovascular: S1, S2 normal, no murmur, rub or gallop, regular rate and rhythm Respiratory: CTAB Extremities: no clubbing, cyanosis or edema Skin: Warm and dry.   Medications: Current Facility-Administered Medications  Medication Dose Route Frequency Provider Last Rate Last Admin  . acetaminophen (TYLENOL) tablet 650 mg  650 mg Oral Q4H PRN Gita Kudo, MD   650 mg at 06/19/20 0919  . benzocaine-Menthol (DERMOPLAST) 20-0.5 % topical spray 1 application  1 application Topical PRN Gita Kudo, MD   1 application at 06/17/20 1851  . coconut oil  1 application Topical PRN Gita Kudo, MD   1 application at 06/17/20 1851  . witch hazel-glycerin (TUCKS) pad 1 application  1 application Topical PRN Marsala, Arlana Pouch, MD       And  . dibucaine (NUPERCAINAL) 1 % rectal ointment 1 application  1 application Rectal PRN Gita Kudo, MD      . diphenhydrAMINE (BENADRYL) capsule 25 mg  25 mg Oral Q6H PRN Gita Kudo, MD      . docusate sodium (COLACE) capsule 100 mg  100 mg Oral BID PRN [place] Bing, MD      . famotidine (PEPCID) tablet 20 mg  20 mg Oral Daily [place] Bing, MD  20 mg at 06/19/20 0920  . labetalol (NORMODYNE) injection 20 mg  20 mg Intravenous PRN Gita Kudo, MD   20 mg at 06/17/20 0747   And  . labetalol (NORMODYNE) injection 40 mg  40 mg Intravenous PRN Gita Kudo, MD   40 mg at 06/17/20 1038   And  . labetalol (NORMODYNE) injection 80 mg  80 mg Intravenous PRN Gita Kudo, MD       And  . hydrALAZINE (APRESOLINE) injection 10 mg  10 mg Intravenous PRN Gita Kudo, MD      . ibuprofen (ADVIL) tablet 600 mg  600 mg Oral Q6H Gita Kudo, MD   600 mg at 06/19/20 1236  . lidocaine-EPINEPHrine (XYLOCAINE W/EPI) 1 %-1:100000 (with pres) injection 3 mL  3 mL Intradermal Once [place] Bing, MD      . nicotine (NICODERM CQ - dosed in mg/24 hours) patch 21 mg  21 mg  Transdermal Daily PRN [place] Bing, MD   21 mg at 06/17/20 1826  . NIFEdipine (PROCARDIA-XL/NIFEDICAL-XL) 24 hr tablet 30 mg  30 mg Oral BID Constant, Peggy, MD   30 mg at 06/19/20 0914  . ondansetron (ZOFRAN) tablet 4 mg  4 mg Oral Q4H PRN Gita Kudo, MD       Or  . ondansetron [facility]) injection 4 mg  4 mg Intravenous Q4H PRN Gita Kudo, MD      . prenatal multivitamin tablet 1 tablet  1 tablet Oral Q1200 Gita Kudo, MD   1 tablet at 06/19/20 1236  . senna-docusate (Senokot-S) tablet 2 tablet  2 tablet Oral Q24H Gita Kudo, MD   2 tablet at 06/17/20 2354  . simethicone (MYLICON) chewable tablet 80 mg  80 mg Oral PRN Gita Kudo, MD      . Tdap Leda Min) injection 0.5 mL  0.5 mL Intramuscular Once Gita Kudo, MD      . zolpidem [facility]) tablet 5 mg  5 mg Oral QHS PRN Hermina Staggers, MD   5 mg at 06/18/20 0101    Labs:  No new issues  Radiology:  No new imaging  Assessment & Plan:  Pt doing well *PP: routine care. Pumping. O POS. No circ *Severe pre-eclampsia: rpt BP mid afternoon and if still okay then fine for d/c today *SW:s/p consult *PPx: OOB ad lib *FEN/GI: regular diet, sliv *Dispo: likely later today  Cornelia Copa MD Attending Center for [facility] (Faculty Practice) GYN Consult Phone: [phone] (M-F, 0800-1700) & [phone] (Off hours, weekends, holidays)

## 2020-06-19 NOTE — Progress Notes (Signed)
OB Note  Patient note in room or in NICU. RN to let me know when patient is in room so I can check up on her.  Cornelia Copa MD Attending Center for [employer] (Faculty Practice) 06/19/2020 Time: [phone]

## 2020-06-20 ENCOUNTER — Ambulatory Visit: Payer: Self-pay

## 2020-06-20 NOTE — Lactation Note (Signed)
This note was copied from a baby's chart. Lactation Consultation Note  Patient Name: Sheena [name] DUKGU'R Date: 06/20/2020 Reason for consult: Follow-up assessment;NICU baby  LC to room for f/u visit. Mom holding sleeping baby. She is pumping infrequently but intends to pump more often today. Provided top for hands-free pumping. Patient was provided with the opportunity to ask questions. All concerns were addressed.   Will return to assist prn.   Consult Status Consult Status: Follow-up Date: 06/21/20 Follow-up type: In-patient    [name] 06/20/2020, 11:38 AM

## 2020-06-21 ENCOUNTER — Other Ambulatory Visit: Payer: Medicaid Other

## 2020-06-22 ENCOUNTER — Ambulatory Visit: Payer: Self-pay

## 2020-06-22 NOTE — Lactation Note (Signed)
This note was copied from a baby's chart. Lactation Consultation Note  Patient Name: Sheena [name] [id number] Date: 06/22/2020 Reason for consult: Follow-up assessment;NICU baby  LC to room for follow up and 1100 feeding appointment. Mom is comfortable positioning baby at breast and demonstrated cross cradle and football holds. LC assisted with slight adjustments and applied a 44mm shield. Baby held nipple in his mouth but did not suck with stimulation. Reviewed feeding norms at 35+ weeks. Encouraged mom to continue pumping until baby is bf well. Mom is currently pumping about 6-8 times per day.  LATCH Score Latch: Too sleepy or reluctant, no latch achieved, no sucking elicited.  Audible Swallowing: None  Type of Nipple: Everted at rest and after stimulation  Comfort (Breast/Nipple): Soft / non-tender  Hold (Positioning): Assistance needed to correctly position infant at breast and maintain latch.  LATCH Score: 5  Interventions Interventions: Breast feeding basics reviewed;Support pillows;Assisted with latch;Position options;Skin to skin;Expressed milk;Hand express;DEBP;Adjust position  Lactation Tools Discussed/Used Tools: Nipple Shields Nipple shield size: 16   Consult Status Consult Status: Follow-up Date: 06/23/20 Follow-up type: In-patient    [name] 06/22/2020, 11:23 AM

## 2020-06-23 ENCOUNTER — Ambulatory Visit: Payer: Self-pay

## 2020-06-23 NOTE — Lactation Note (Signed)
This note was copied from a baby's chart. Lactation Consultation Note  Patient Name: Sheena [name] [id number] Date: 06/23/2020 Reason for consult: Follow-up assessment;Mother's request;NICU baby   Mom with questions about nipple shield. Concern addressed and resolved. Baby bf well at last feeding, per mom. No charge.  Consult Status Consult Status: Follow-up Date: 06/24/20 Follow-up type: In-patient    [name] 06/23/2020, 12:57 PM

## 2020-06-24 ENCOUNTER — Ambulatory Visit: Payer: Self-pay

## 2020-06-24 NOTE — Lactation Note (Signed)
This note was copied from a baby's chart. Lactation Consultation Note  Patient Name: Sheena [name] [id number] Date: 06/24/2020 Reason for consult: Follow-up assessment   LC Follow Up Visit:  RN called for latch assistance as discussed earlier.  Baby awake, calm and stretching when I arrived.  Mother interested in trying to latch without the NS at this feeding.  Mother hand expressed colostrum drops and I attempted to latch baby to the left breast in the cross cradle hold.  Baby was not at all interested in opening his mouth to latch.  Gentle stimulation provided but he preferred to sleep; no initiative taken at this time.  Placed him STS on mother's chest with her robe to cover him.  He remained sleeping.    Mother began crying during this attempt.  She stated frustration because she wants to feed him when "he is ready" and not when the scheduled feedings are.  She expressed that he wakes up at times other than his scheduled feedings and is not allowed to feed him.  Emotional support provided and reassured mother that this is typical behavior for a baby at this age.  Reinforced giving baby time to be developmentally ready for breast feeding.  Discussed benefits of stressing him when he is not ready to feed.  Informed mother that I would speak with the RN to determine if there was anything else we could do to help alleviate her frustration.  RN updated and went back to mother's room for a visit with her.    Informed mother to call when she notices readiness and, if lactation support is available, we would be glad to assist.  Mother verbalized understanding.     Maternal Data    Feeding Feeding Type: Formula  LATCH Score Latch: Too sleepy or reluctant, no latch achieved, no sucking elicited.  Audible Swallowing: None  Type of Nipple: Everted at rest and after stimulation  Comfort (Breast/Nipple): Soft / non-tender  Hold (Positioning): Assistance needed to correctly position infant  at breast and maintain latch.  LATCH Score: 5  Interventions Interventions: Assisted with latch;Skin to skin;Hand express  Lactation Tools Discussed/Used     Consult Status Consult Status: Follow-up Date: 06/25/20 Follow-up type: In-patient    Dora Sims 06/24/2020, 11:37 AM

## 2020-06-24 NOTE — Lactation Note (Signed)
This note was copied from a baby's chart. Lactation Consultation Note  Patient Name: Boy Tyisha Cressy GYIRS'W Date: 06/24/2020 Reason for consult: Follow-up assessment   LC Follow Up Visit:  RN requested LC for NS assistance.  Mother stated that she was given a NS for latch assistance but the size was not correct.  The #16 is too small so she tried the #20.  She is having difficulty getting baby to latch and sustain his latch with the NS.  Mother informed me that "they come in and just leave."  She would like someone to watch and assist with the NS.  I offered to return for the next feeding.  Mother plans to feed at approximately 1100.  I will return at that time unless baby awakens earlier and shows feeding cues.  Also provided a belly band per mother's request.  She is using this as a "hands free" bra during pumping.  Provided band.  RN updated.   Maternal Data    Feeding Feeding Type: Formula  LATCH Score Latch: Grasps breast easily, tongue down, lips flanged, rhythmical sucking.  Audible Swallowing: Spontaneous and intermittent  Type of Nipple: Everted at rest and after stimulation  Comfort (Breast/Nipple): Soft / non-tender  Hold (Positioning): No assistance needed to correctly position infant at breast.  LATCH Score: 10  Interventions    Lactation Tools Discussed/Used Tools: Pump;27F feeding tube / Syringe   Consult Status Consult Status: Follow-up Date: 06/25/20 Follow-up type: In-patient    Dora Sims 06/24/2020, 8:47 AM

## 2020-06-25 ENCOUNTER — Encounter: Payer: Medicaid Other | Admitting: Women's Health

## 2020-06-25 ENCOUNTER — Other Ambulatory Visit: Payer: Medicaid Other

## 2020-06-27 ENCOUNTER — Ambulatory Visit: Payer: Self-pay

## 2020-06-27 NOTE — Lactation Note (Signed)
This note was copied from a baby's chart. Lactation Consultation Note  Patient Name: Sheena [name] [id number] Date: 06/27/2020 Reason for consult: Follow-up assessment;NICU baby  LC to room for f/u visit. Mom continues to pump and bottle feed. She plans to re-challenge at breast p d/c. We reviewed developmental readiness to bf and pumping to maintain milk supply. Mom's supply has decreased to about per pumping. Patient was provided with the opportunity to ask questions. All concerns were addressed.  Will plan follow up visit.    Consult Status Consult Status: Follow-up Date: 06/28/20 Follow-up type: In-patient    Sheena [name] 06/27/2020, 5:04 PM

## 2020-06-28 ENCOUNTER — Ambulatory Visit: Payer: Self-pay

## 2020-06-28 NOTE — Lactation Note (Signed)
This note was copied from a baby's chart. Lactation Consultation Note  Patient Name: Boy Maud Rubendall RCVEL'F Date: 06/28/2020 Reason for consult: Follow-up assessment;NICU baby;Late-preterm 34-36.6wks  0855 - 0925 - I followed up with Ms. Essick and her 58 day old son, Jean Rosenthal. She anticipates discharge tomorrow or today. Audiology entered the room during my consult to conduct a hearing screen.  Ms. Bowmer plans to call [facility] and enroll on Monday. I provided her with a [facility] loaner pump today and reviewed return date and instructions. $30 deposit provided, and Ms. Doswell expressed her gratitude.  She states that she pumps approximately 20 mls at a time. We reviewed pumping frequency and pump settings.  Ms. Gaffin desired to breast feed, but she states that she feels it would go much better to try from home. She also feels that her milk supply will be stronger at home. She does have three other children who are older (61 years old and 55).  I reviewed our community breast feeding resources and encouraged a follow up lactation OP appointment. She was interested in an OP appointment. She also expressed interest in a follow up call to check on her after discharge.  I recommended that she continue to pump 8+ times a day, hold baby STS and allow him to lick and latch on demand. Continue to supplement per her feeding plan.  She will likely choose either [facility] in [place] or [facility]. She has not made a decision at this time.   Interventions Interventions: Breast feeding basics reviewed;DEBP  Lactation Tools Discussed/Used WIC Program:  (new enrollment; [place]) Pump Review: Setup, frequency, and cleaning;Milk Storage   Consult Status Consult Status: Follow-up Date: 06/29/20 Follow-up type: Call as needed    Walker Shadow 06/28/2020, 10:03 AM

## 2020-07-02 ENCOUNTER — Encounter: Payer: Medicaid Other | Admitting: Women's Health

## 2020-07-02 ENCOUNTER — Other Ambulatory Visit: Payer: Medicaid Other

## 2020-07-05 ENCOUNTER — Other Ambulatory Visit: Payer: Medicaid Other

## 2020-07-09 ENCOUNTER — Encounter: Payer: Medicaid Other | Admitting: Women's Health

## 2020-07-09 ENCOUNTER — Other Ambulatory Visit: Payer: Medicaid Other

## 2020-07-12 ENCOUNTER — Other Ambulatory Visit: Payer: Medicaid Other

## 2020-07-16 ENCOUNTER — Other Ambulatory Visit: Payer: Medicaid Other

## 2020-07-16 ENCOUNTER — Encounter: Payer: Medicaid Other | Admitting: Women's Health

## 2020-07-19 ENCOUNTER — Other Ambulatory Visit: Payer: Medicaid Other

## 2020-07-22 ENCOUNTER — Ambulatory Visit: Payer: Medicaid Other | Admitting: Advanced Practice Midwife

## 2021-04-13 ENCOUNTER — Emergency Department (HOSPITAL_BASED_OUTPATIENT_CLINIC_OR_DEPARTMENT_OTHER)
Admission: EM | Admit: 2021-04-13 | Discharge: 2021-04-13 | Disposition: A | Discharge status: Home / Self Care | Payer: Medicaid Other | Attending: Emergency Medicine | Admitting: Emergency Medicine

## 2021-04-13 ENCOUNTER — Emergency Department (HOSPITAL_BASED_OUTPATIENT_CLINIC_OR_DEPARTMENT_OTHER): Payer: Medicaid Other

## 2021-04-13 ENCOUNTER — Encounter (HOSPITAL_BASED_OUTPATIENT_CLINIC_OR_DEPARTMENT_OTHER): Payer: Self-pay | Admitting: Urology

## 2021-04-13 ENCOUNTER — Other Ambulatory Visit: Payer: Self-pay

## 2021-04-13 DIAGNOSIS — R0789 Other chest pain: Secondary | ICD-10-CM | POA: Diagnosis not present

## 2021-04-13 DIAGNOSIS — M549 Dorsalgia, unspecified: Secondary | ICD-10-CM | POA: Diagnosis not present

## 2021-04-13 DIAGNOSIS — R000 Tachycardia, unspecified: Secondary | ICD-10-CM | POA: Insufficient documentation

## 2021-04-13 DIAGNOSIS — R079 Chest pain, unspecified: Secondary | ICD-10-CM | POA: Diagnosis present

## 2021-04-13 DIAGNOSIS — F17210 Nicotine dependence, cigarettes, uncomplicated: Secondary | ICD-10-CM | POA: Diagnosis not present

## 2021-04-13 DIAGNOSIS — R1013 Epigastric pain: Secondary | ICD-10-CM | POA: Diagnosis not present

## 2021-04-13 LAB — CBC WITH DIFFERENTIAL/PLATELET
Abs Immature Granulocytes: 0.01 10*3/uL (ref 0.00–0.07)
Basophils Absolute: 0.1 10*3/uL (ref 0.0–0.1)
Basophils Relative: 1 %
Eosinophils Absolute: 0.4 10*3/uL (ref 0.0–0.5)
Eosinophils Relative: 4 %
HCT: 40.2 % (ref 36.0–46.0)
Hemoglobin: 13.8 g/dL (ref 12.0–15.0)
Immature Granulocytes: 0 %
Lymphocytes Relative: 36 %
Lymphs Abs: 3.8 10*3/uL (ref 0.7–4.0)
MCH: 29.3 pg (ref 26.0–34.0)
MCHC: 34.3 g/dL (ref 30.0–36.0)
MCV: 85.4 fL (ref 80.0–100.0)
Monocytes Absolute: 0.6 10*3/uL (ref 0.1–1.0)
Monocytes Relative: 6 %
Neutro Abs: 5.6 10*3/uL (ref 1.7–7.7)
Neutrophils Relative %: 53 %
Platelets: 407 10*3/uL — ABNORMAL HIGH (ref 150–400)
RBC: 4.71 MIL/uL (ref 3.87–5.11)
RDW: 15.4 % (ref 11.5–15.5)
WBC: 10.6 10*3/uL — ABNORMAL HIGH (ref 4.0–10.5)
nRBC: 0 % (ref 0.0–0.2)

## 2021-04-13 LAB — BASIC METABOLIC PANEL
Anion gap: 10 (ref 5–15)
BUN: 6 mg/dL (ref 6–20)
CO2: 22 mmol/L (ref 22–32)
Calcium: 8.3 mg/dL — ABNORMAL LOW (ref 8.9–10.3)
Chloride: 107 mmol/L (ref 98–111)
Creatinine, Ser: 0.69 mg/dL (ref 0.44–1.00)
GFR, Estimated: >60 mL/min (ref >60)
Glucose, Bld: 93 mg/dL (ref 70–99)
Potassium: 3.3 mmol/L — ABNORMAL LOW (ref 3.5–5.1)
Sodium: 139 mmol/L (ref 135–145)

## 2021-04-13 LAB — TROPONIN I (HIGH SENSITIVITY): Troponin I (High Sensitivity): <2 ng/L (ref <18)

## 2021-04-13 LAB — D-DIMER, QUANTITATIVE: D-Dimer, Quant: <0.27 ug/mL-FEU (ref 0.00–0.50)

## 2021-04-13 MED ORDER — OMEPRAZOLE 20 MG PO CPDR: 20.0000 mg | DELAYED_RELEASE_CAPSULE | Freq: Two times a day (BID) | ORAL | 0 refills | Status: AC

## 2021-04-13 NOTE — ED Provider Notes (Signed)
[facility] EMERGENCY DEPARTMENT Provider Note   CSN: [id number] Arrival date & time: 04/13/21  0049     History Chief Complaint  Patient presents with   Back Pain   Chest Pain    Sheena [name] is a 35 y.o. female.  HPI     This a 35 year old female with a history of reflux, panic attacks, smoking who presents with chest pain.  Patient reports intermittent episodes of chest and epigastric pain that radiates to her shoulder blades in her left arm.  It has been on and off for the last 6 months.  Nothing seems to make the pain better or worse.  She has been taking over-the-counter pain relievers with minimal results.  She does take omeprazole daily and states that her heartburn in general has "gotten worse."  Denies nausea or vomiting.  Currently she rates her pain at 8 out of 10.  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   Supervision of high risk pregnancy, antepartum 03/06/2020   Late prenatal care in second trimester 03/06/2020   History of pre-eclampsia in prior pregnancy, currently pregnant 03/06/2020   Marijuana use 03/06/2020   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   Smoker 03/06/2020   Alcohol use 03/06/2020   History of postpartum hemorrhage 03/06/2020    Past Surgical History:  Procedure Laterality Date   INSERTION OF IMPLANON ROD  06/18/2020       NO PAST SURGERIES     REMOVAL OF IMPLANON ROD  06/18/2020         OB History     Gravida  3   Para  2   Term  0   Preterm  2   AB       Living  3      SAB      IAB      Ectopic      Multiple  1   Live Births  3           Family History  Problem Relation Age of Onset   Cancer Mother        colon   Healthy Father    Colon polyps Sister    Diabetes Maternal Grandmother    Dementia Paternal Grandmother     Social History   Tobacco Use   Smoking status: Every Day    Packs/day: 2.00    Years: 20.00    Pack years: 40.00    Types: Cigarettes   Smokeless tobacco: Never  Vaping Use   Vaping Use: Never used  Substance Use Topics   Alcohol use: Yes    Alcohol/week: 3.0 standard drinks    Types: 3 Cans of beer per week    Comment: daily   Drug use: Not Currently    Frequency: 21.0 times per week    Types: Marijuana    Comment: 3 times a day    Home Medications Prior to Admission medications   Medication Sig Start Date End Date Taking? Authorizing Provider  acetaminophen (TYLENOL)  500 MG tablet Take 500 mg by mouth every 6 (six) hours as needed.    [provider]  Blood Pressure Monitor MISC For regular home bp monitoring during pregnancy 03/06/20   Cheral Marker, CNM  docusate sodium (COLACE) 100 MG capsule TAKE 1 CAPSULE (100 MG TOTAL) BY MOUTH TWO TIMES DAILY AS NEEDED FOR MILD CONSTIPATION. 06/19/20 06/19/21  Eureka Bing, MD  guaiFENesin (MUCINEX) 600 MG 12 hr tablet Take by mouth 2 (two) times daily.    [provider]  ibuprofen (ADVIL) 600 MG tablet TAKE 1 TABLET (600 MG TOTAL) BY MOUTH EVERY SIX HOURS AS NEEDED. 06/19/20 06/19/21  [place] Bing, MD  NIFEdipine (ADALAT CC) 30 MG 24 hr tablet TAKE 1 TABLET BY MOUTH TWO TIMES DAILY 06/19/20 06/19/21  [place] Bing, MD  omeprazole (PRILOSEC) 20 MG capsule Take 1 capsule (20 mg total) by mouth 2 (two) times daily before a meal. 04/13/21   Chaim Gatley, Mayer Masker, MD  Prenatal Vit-Fe Fumarate-FA (PRENATAL MULTIVITAMIN) TABS tablet Take 1 tablet by mouth daily at 12 noon.    [provider]    Allergies     Patient has no allergy information on record.  Review of Systems   Review of Systems  Constitutional:  Negative for fever.  Respiratory:  Negative for shortness of breath.   Cardiovascular:  Positive for chest pain. Negative for leg swelling.  Gastrointestinal:  Positive for abdominal pain. Negative for nausea and vomiting.  All other systems reviewed and are negative.  Physical Exam Updated Vital Signs BP (!) 146/96   Pulse 96   Temp 98.1 F (36.7 C) (Oral)   Resp 20   Ht 1.626 m (5\' 4" )   Wt 81.6 kg   LMP 04/09/2021   SpO2 100%   BMI 30.90 kg/m   Physical Exam Vitals and nursing note reviewed.  Constitutional:      Appearance: She is well-developed. She is not ill-appearing.  HENT:     Head: Normocephalic and atraumatic.  Eyes:     Pupils: Pupils are equal, round, and reactive to light.  Cardiovascular:     Rate and Rhythm: Normal rate and regular rhythm.     Heart sounds: Normal heart sounds.  Pulmonary:     Effort: Pulmonary effort is normal. No respiratory distress.     Breath sounds: No wheezing.  Abdominal:     General: Bowel sounds are normal.     Palpations: Abdomen is soft.     Tenderness: There is no abdominal tenderness.  Musculoskeletal:     Cervical back: Neck supple.     Right lower leg: No tenderness. No edema.     Left lower leg: No tenderness. No edema.  Skin:    General: Skin is warm and dry.  Neurological:     Mental Status: She is alert and oriented to person, place, and time.  Psychiatric:        Mood and Affect: Mood normal.    ED Results / Procedures / Treatments   Labs (all labs ordered are listed, but only abnormal results are displayed) Labs Reviewed  CBC WITH DIFFERENTIAL/PLATELET - Abnormal; Notable for the following components:      Result Value   WBC 10.6 (*)    Platelets 407 (*)    All other components within normal limits  BASIC METABOLIC PANEL - Abnormal; Notable for the following components:   Potassium 3.3 (*)     Calcium 8.3 (*)    All other components within normal  limits  D-DIMER, QUANTITATIVE  TROPONIN I (HIGH SENSITIVITY)  TROPONIN I (HIGH SENSITIVITY)    EKG EKG Interpretation  Date/Time:  Sunday April 13 2021 01:03:43 EDT Ventricular Rate:  102 PR Interval:  130 QRS Duration: 88 QT Interval:  353 QTC Calculation: 460 R Axis:   93 Text Interpretation: Sinus tachycardia Borderline right axis deviation Confirmed by Danuta Huseman ([id number]) on 04/13/2021 2:32:28 AM  Radiology DG Chest 2 View  Result Date: 04/13/2021 CLINICAL DATA:  Chest/back pain EXAM: CHEST - 2 VIEW COMPARISON:  07/08/2008 FINDINGS: Lungs are clear.  No pleural effusion or pneumothorax. The heart is normal in size. Visualized osseous structures are within normal limits. IMPRESSION: Normal chest radiographs. Electronically Signed   By: Sriyesh  Krishnan M.D.   On: 04/13/2021 02:27    Procedures Procedures   Medications Ordered in ED Medications - No data to display  ED Course  I have reviewed the triage vital signs and the nursing notes.  Pertinent labs & imaging results that were available during my care of the patient were reviewed by me and considered in my medical decision making (see chart for details).    MDM Rules/Calculators/A&P                           Patient presents with chest pain.  She is overall nontoxic and vital signs are notable for blood pressure of 146/96.  She is anxious appearing but nontoxic.  Physical exam is benign.  Considerations include but not limited to, worsening reflux, ACS, PE.  EKG shows no acute ischemic or arrhythmic changes.  Chest x-ray without pneumothorax or pneumonia.  Troponin x1 negative.  Feel that this is adequate given her low risk profile and duration of symptoms.  Other lab work-up is largely reassuring.  D-dimer is negative and doubt PE.  Given worsening reflux symptoms, would recommend increasing omeprazole.  After history, exam, and medical workup I feel the  patient has been appropriately medically screened and is safe for discharge home. Pertinent diagnoses were discussed with the patient. Patient was given return precautions.  Final Clinical Impression(s) / ED Diagnoses Final diagnoses:  Atypical chest pain    Rx / DC Orders ED Discharge Orders          Ordered    omeprazole (PRILOSEC) 20 MG capsule  2 times daily before meals        09 /11/22 0302             Yosgar Demirjian, 09-12-1993, MD 04/13/21 [phone]

## 2021-04-13 NOTE — ED Notes (Signed)
Pt A&OX4, ambulatory at d/c with independent steady gait

## 2021-04-13 NOTE — ED Triage Notes (Signed)
Pt states intermittent episodes x 6 months of pain that starts in shoulder blades, radiates into chest and into left arm.

## 2021-04-13 NOTE — Discharge Instructions (Addendum)
You were seen today for chest pain.  Your work-up is largely reassuring.  This could be related to reflux given your history.  Increase omeprazole to 40 mg daily.  Follow-up with your primary physician.

## 2024-08-14 ENCOUNTER — Emergency Department (HOSPITAL_BASED_OUTPATIENT_CLINIC_OR_DEPARTMENT_OTHER)

## 2024-08-14 ENCOUNTER — Other Ambulatory Visit: Payer: Self-pay

## 2024-08-14 ENCOUNTER — Encounter (HOSPITAL_BASED_OUTPATIENT_CLINIC_OR_DEPARTMENT_OTHER): Payer: Self-pay | Admitting: Emergency Medicine

## 2024-08-14 ENCOUNTER — Emergency Department (HOSPITAL_BASED_OUTPATIENT_CLINIC_OR_DEPARTMENT_OTHER)
Admission: EM | Admit: 2024-08-14 | Discharge: 2024-08-15 | Disposition: A | Attending: Emergency Medicine | Admitting: Emergency Medicine

## 2024-08-14 DIAGNOSIS — M7989 Other specified soft tissue disorders: Secondary | ICD-10-CM | POA: Diagnosis not present

## 2024-08-14 DIAGNOSIS — R079 Chest pain, unspecified: Secondary | ICD-10-CM | POA: Diagnosis present

## 2024-08-14 DIAGNOSIS — R0602 Shortness of breath: Secondary | ICD-10-CM | POA: Insufficient documentation

## 2024-08-14 DIAGNOSIS — I10 Essential (primary) hypertension: Secondary | ICD-10-CM | POA: Diagnosis not present

## 2024-08-14 DIAGNOSIS — Z79899 Other long term (current) drug therapy: Secondary | ICD-10-CM | POA: Diagnosis not present

## 2024-08-14 DIAGNOSIS — R0789 Other chest pain: Secondary | ICD-10-CM | POA: Diagnosis not present

## 2024-08-14 LAB — CBC
HCT: 40.2 % (ref 36.0–46.0)
Hemoglobin: 14.3 g/dL (ref 12.0–15.0)
MCH: 30.9 pg (ref 26.0–34.0)
MCHC: 35.6 g/dL (ref 30.0–36.0)
MCV: 86.8 fL (ref 80.0–100.0)
Platelets: 369 K/uL (ref 150–400)
RBC: 4.63 MIL/uL (ref 3.87–5.11)
RDW: 11.9 % (ref 11.5–15.5)
WBC: 11.1 K/uL — ABNORMAL HIGH (ref 4.0–10.5)
nRBC: 0 % (ref 0.0–0.2)

## 2024-08-14 LAB — BASIC METABOLIC PANEL WITH GFR
Anion gap: 13 (ref 5–15)
BUN: 10 mg/dL (ref 6–20)
CO2: 20 mmol/L — ABNORMAL LOW (ref 22–32)
Calcium: 8.9 mg/dL (ref 8.9–10.3)
Chloride: 100 mmol/L (ref 98–111)
Creatinine, Ser: 0.63 mg/dL (ref 0.44–1.00)
GFR, Estimated: >60 mL/min
Glucose, Bld: 93 mg/dL (ref 70–99)
Potassium: 4.2 mmol/L (ref 3.5–5.1)
Sodium: 134 mmol/L — ABNORMAL LOW (ref 135–145)

## 2024-08-14 LAB — TROPONIN T, HIGH SENSITIVITY: Troponin T High Sensitivity: <15 ng/L (ref 0–19)

## 2024-08-14 NOTE — ED Triage Notes (Signed)
 Pt c/o chest pain x 5 days. Has referral for cardiology f/u but hasn't heard from office yet.  Reports pain is worse at night.  + shob, nausea, BLLE cramping.

## 2024-08-15 MED ORDER — KETOROLAC TROMETHAMINE 60 MG/2ML IM SOLN
30.0000 mg | Freq: Once | INTRAMUSCULAR | Status: AC
  Administered 2024-08-15: 30 mg via INTRAMUSCULAR
  Filled 2024-08-15: qty 2

## 2024-08-15 NOTE — ED Provider Notes (Signed)
 " Sheena [name] Provider Note   CSN: [id number] Arrival date & time: 08/14/24  1958     Patient presents with: Chest Pain   Sheena [name] is a 39 y.o. female.   The history is provided by the patient.  Chest Pain Sheena [name] is a 39 y.o. female who presents to the Emergency Department complaining of chest pain.  She presents to the emergency department for evaluation of left-sided chest pain that started 5 days ago.  It is described as a tight and achy feeling that is constant and uncomfortable.  She has mild shortness of breath that is worse at night.  No fever or cough.  She did have some swelling to bilateral feet around Christmas but this is overall getting better.  She has chronic diarrhea.  No vomiting or abdominal pain.  She reports several months of palpitations.  She saw her PCP and is planning to get referred to cardiology.  She has a history of hypertension, hyperlipidemia, anxiety.  She does not use oral contraceptives.  No history of DVT/PE.  No chance of pregnancy.  She does take omeprazole  for reflux.  She does use tobacco, drinks alcohol nightly, 5-8 beers.  No drug use.         Prior to Admission medications  Medication Sig Start Date End Date Taking? Authorizing Provider  acetaminophen  (TYLENOL ) 500 MG tablet Take 500 mg by mouth every 6 (six) hours as needed.    [provider]  Blood Pressure Monitor MISC For regular home bp monitoring during pregnancy 03/06/20   Kizzie Suzen SAUNDERS, CNM  guaiFENesin (MUCINEX) 600 MG 12 hr tablet Take by mouth 2 (two) times daily.    [provider]  NIFEdipine  (ADALAT  CC) 30 MG 24 hr tablet TAKE 1 TABLET BY MOUTH TWO TIMES DAILY 06/19/20 06/19/21  Izell Harari, MD  omeprazole  (PRILOSEC) 20 MG capsule Take 1 capsule (20 mg total) by mouth 2 (two) times daily before a meal. 04/13/21   Horton, Charmaine FALCON, MD  Prenatal Vit-Fe Fumarate-FA (PRENATAL MULTIVITAMIN)  TABS tablet Take 1 tablet by mouth daily at 12 noon.    [provider]    Allergies: Patient has no known allergies.    Review of Systems  Cardiovascular:  Positive for chest pain.  All other systems reviewed and are negative.   Updated Vital Signs BP (!) 143/96   Pulse 80   Temp 98.1 F (36.7 C) (Oral)   Resp 20   Ht 5' 4 (1.626 m)   Wt 89.8 kg   LMP 08/08/2024 (Approximate)   SpO2 100%   Breastfeeding No   BMI 33.99 kg/m   Physical Exam Vitals and nursing note reviewed.  Constitutional:      Appearance: She is well-developed.  HENT:     Head: Normocephalic and atraumatic.  Cardiovascular:     Rate and Rhythm: Normal rate and regular rhythm.     Heart sounds: No murmur heard. Pulmonary:     Effort: Pulmonary effort is normal. No respiratory distress.     Breath sounds: Normal breath sounds.  Abdominal:     Palpations: Abdomen is soft.     Tenderness: There is no abdominal tenderness. There is no guarding or rebound.  Musculoskeletal:        General: No swelling or tenderness.  Skin:    General: Skin is warm and dry.  Neurological:     Mental Status: She is alert and oriented to  person, place, and time.  Psychiatric:        Behavior: Behavior normal.     (all labs ordered are listed, but only abnormal results are displayed) Labs Reviewed  BASIC METABOLIC PANEL WITH GFR - Abnormal; Notable for the following components:      Result Value   Sodium 134 (*)    CO2 20 (*)    All other components within normal limits  CBC - Abnormal; Notable for the following components:   WBC 11.1 (*)    All other components within normal limits  PREGNANCY, URINE  TROPONIN T, HIGH SENSITIVITY    EKG: None  Radiology: DG Chest 2 View Result Date: 08/14/2024 EXAM: 2 VIEW(S) XRAY OF THE CHEST 08/14/2024 08:32:06 PM COMPARISON: 04/13/2021 CLINICAL HISTORY: chest pain FINDINGS: LUNGS AND PLEURA: No focal pulmonary opacity. No pleural effusion. No pneumothorax.  HEART AND MEDIASTINUM: No acute abnormality of the cardiac and mediastinal silhouettes. BONES AND SOFT TISSUES: No acute osseous abnormality. IMPRESSION: 1. No acute cardiopulmonary pathology. Electronically signed by: Oneil Devonshire MD MD 08/14/2024 09:15 PM EST RP Workstation: [id number]     Procedures   Medications Ordered in the ED  ketorolac  (TORADOL ) injection 30 mg (30 mg Intramuscular Given 08/15/24 0027)                                    Medical Decision Making Amount and/or Complexity of Data Reviewed Labs: ordered. Radiology: ordered.  Risk Prescription drug management.   Patient here for evaluation of 5 days of chest pain.  EKG is nonischemic and troponin is negative.  Given 5 days of symptoms does not require 2 troponins.  Chest x-ray is negative for acute abnormality-images personally reviewed and interpreted, agree with radiologist interpretation. CBC with mild leukocytosis, history of same in the past.  No evidence of acute infectious process at this time.  Doubt PE, she is PERC negative.  Discussed with patient unclear source of symptoms, suspect musculoskeletal chest pain.  Feel she is stable for discharge home with outpatient follow-up and return precautions.    Final diagnoses:  Atypical chest pain    ED Discharge Orders     None          Griselda Norris, MD 08/15/24 0127  "

## 2024-09-06 ENCOUNTER — Ambulatory Visit: Admitting: Internal Medicine

## 2024-09-21 ENCOUNTER — Ambulatory Visit: Admitting: Internal Medicine
